# Patient Record
Sex: Female | Born: 1965
Health system: Southern US, Community
[De-identification: ages and names within clinical notes are randomized; demographics above are authoritative.]

## PROBLEM LIST (undated history)

## (undated) DIAGNOSIS — G4730 Sleep apnea, unspecified: Secondary | ICD-10-CM

## (undated) DIAGNOSIS — D689 Coagulation defect, unspecified: Secondary | ICD-10-CM

## (undated) DIAGNOSIS — I10 Essential (primary) hypertension: Secondary | ICD-10-CM

## (undated) HISTORY — PX: ABDOMINAL HYSTERECTOMY: SHX81

## (undated) HISTORY — PX: KNEE SURGERY: SHX244

## (undated) HISTORY — PX: TUBAL LIGATION: SHX77

## (undated) HISTORY — PX: BREAST REDUCTION SURGERY: SHX8

## (undated) HISTORY — DX: Coagulation defect, unspecified: D68.9

## (undated) HISTORY — DX: Sleep apnea, unspecified: G47.30

## (undated) HISTORY — PX: REDUCTION MAMMAPLASTY: SUR839

---

## 2002-07-14 ENCOUNTER — Other Ambulatory Visit: Admission: RE | Admit: 2002-07-14 | Discharge: 2002-07-14 | Payer: Self-pay | Admitting: Obstetrics and Gynecology

## 2003-07-17 ENCOUNTER — Other Ambulatory Visit: Admission: RE | Admit: 2003-07-17 | Discharge: 2003-07-17 | Payer: Self-pay | Admitting: Obstetrics and Gynecology

## 2003-09-01 ENCOUNTER — Emergency Department (HOSPITAL_COMMUNITY): Admission: EM | Admit: 2003-09-01 | Discharge: 2003-09-01 | Payer: Self-pay | Admitting: Emergency Medicine

## 2005-08-11 ENCOUNTER — Other Ambulatory Visit: Admission: RE | Admit: 2005-08-11 | Discharge: 2005-08-11 | Payer: Self-pay | Admitting: Obstetrics and Gynecology

## 2007-12-30 ENCOUNTER — Ambulatory Visit: Payer: Self-pay | Admitting: Internal Medicine

## 2007-12-30 LAB — CONVERTED CEMR LAB
ALT: 17 units/L (ref 0–35)
Basophils Absolute: 0 10*3/uL (ref 0.0–0.1)
Basophils Relative: 0.2 % (ref 0.0–3.0)
Bilirubin, Direct: 0.1 mg/dL (ref 0.0–0.3)
Calcium: 9.1 mg/dL (ref 8.4–10.5)
Cholesterol: 178 mg/dL (ref 0–200)
Creatinine, Ser: 0.8 mg/dL (ref 0.4–1.2)
GFR calc Af Amer: 102 mL/min
HCT: 38.8 % (ref 36.0–46.0)
Hemoglobin: 13.7 g/dL (ref 12.0–15.0)
MCHC: 35.3 g/dL (ref 30.0–36.0)
MCV: 92.2 fL (ref 78.0–100.0)
Monocytes Absolute: 0.1 10*3/uL (ref 0.1–1.0)
Neutro Abs: 3.2 10*3/uL (ref 1.4–7.7)
RBC: 4.21 M/uL (ref 3.87–5.11)
RDW: 13.1 % (ref 11.5–14.6)
Sodium: 139 meq/L (ref 135–145)
TSH: 1.52 microintl units/mL (ref 0.35–5.50)
Total Bilirubin: 0.4 mg/dL (ref 0.3–1.2)
Total Protein: 6.8 g/dL (ref 6.0–8.3)
Triglycerides: 96 mg/dL (ref 0–149)

## 2008-03-19 ENCOUNTER — Telehealth: Payer: Self-pay | Admitting: Internal Medicine

## 2008-03-20 ENCOUNTER — Telehealth: Payer: Self-pay | Admitting: Internal Medicine

## 2008-03-20 ENCOUNTER — Ambulatory Visit: Payer: Self-pay | Admitting: Internal Medicine

## 2008-03-20 DIAGNOSIS — K5289 Other specified noninfective gastroenteritis and colitis: Secondary | ICD-10-CM | POA: Insufficient documentation

## 2008-04-17 ENCOUNTER — Ambulatory Visit: Payer: Self-pay | Admitting: Internal Medicine

## 2008-08-14 ENCOUNTER — Ambulatory Visit: Payer: Self-pay | Admitting: Internal Medicine

## 2008-08-14 DIAGNOSIS — M722 Plantar fascial fibromatosis: Secondary | ICD-10-CM | POA: Insufficient documentation

## 2008-08-17 ENCOUNTER — Telehealth: Payer: Self-pay | Admitting: Internal Medicine

## 2008-08-18 ENCOUNTER — Ambulatory Visit: Payer: Self-pay | Admitting: Internal Medicine

## 2008-08-18 DIAGNOSIS — T887XXA Unspecified adverse effect of drug or medicament, initial encounter: Secondary | ICD-10-CM | POA: Insufficient documentation

## 2009-01-06 ENCOUNTER — Encounter (INDEPENDENT_AMBULATORY_CARE_PROVIDER_SITE_OTHER): Payer: Self-pay | Admitting: Obstetrics and Gynecology

## 2009-01-06 ENCOUNTER — Ambulatory Visit (HOSPITAL_COMMUNITY): Admission: RE | Admit: 2009-01-06 | Discharge: 2009-01-08 | Payer: Self-pay | Admitting: Obstetrics and Gynecology

## 2009-01-17 ENCOUNTER — Inpatient Hospital Stay (HOSPITAL_COMMUNITY): Admission: AD | Admit: 2009-01-17 | Discharge: 2009-01-17 | Payer: Self-pay | Admitting: Obstetrics and Gynecology

## 2009-01-21 ENCOUNTER — Telehealth (INDEPENDENT_AMBULATORY_CARE_PROVIDER_SITE_OTHER): Payer: Self-pay | Admitting: *Deleted

## 2009-03-15 ENCOUNTER — Ambulatory Visit: Payer: Self-pay | Admitting: Internal Medicine

## 2009-03-15 DIAGNOSIS — J069 Acute upper respiratory infection, unspecified: Secondary | ICD-10-CM | POA: Insufficient documentation

## 2009-09-18 ENCOUNTER — Emergency Department (HOSPITAL_COMMUNITY): Admission: EM | Admit: 2009-09-18 | Discharge: 2009-09-18 | Payer: Self-pay | Admitting: Family Medicine

## 2010-03-18 ENCOUNTER — Ambulatory Visit: Payer: Self-pay | Admitting: Family Medicine

## 2010-03-18 DIAGNOSIS — H6690 Otitis media, unspecified, unspecified ear: Secondary | ICD-10-CM | POA: Insufficient documentation

## 2010-03-18 DIAGNOSIS — J209 Acute bronchitis, unspecified: Secondary | ICD-10-CM | POA: Insufficient documentation

## 2010-03-18 DIAGNOSIS — R1110 Vomiting, unspecified: Secondary | ICD-10-CM | POA: Insufficient documentation

## 2010-04-18 ENCOUNTER — Ambulatory Visit: Payer: Self-pay | Admitting: Internal Medicine

## 2010-04-18 LAB — CONVERTED CEMR LAB
BUN: 13 mg/dL (ref 6–23)
Basophils Absolute: 0 10*3/uL (ref 0.0–0.1)
Bilirubin, Direct: 0.1 mg/dL (ref 0.0–0.3)
Chloride: 108 meq/L (ref 96–112)
Cholesterol: 161 mg/dL (ref 0–200)
Creatinine, Ser: 0.7 mg/dL (ref 0.4–1.2)
Eosinophils Absolute: 0.3 10*3/uL (ref 0.0–0.7)
Glucose, Bld: 87 mg/dL (ref 70–99)
HCT: 38.2 % (ref 36.0–46.0)
LDL Cholesterol: 101 mg/dL — ABNORMAL HIGH (ref 0–99)
Lymphs Abs: 4.2 10*3/uL — ABNORMAL HIGH (ref 0.7–4.0)
MCV: 93.8 fL (ref 78.0–100.0)
Monocytes Absolute: 0.5 10*3/uL (ref 0.1–1.0)
Neutrophils Relative %: 42.8 % — ABNORMAL LOW (ref 43.0–77.0)
Platelets: 299 10*3/uL (ref 150.0–400.0)
Potassium: 4 meq/L (ref 3.5–5.1)
RDW: 14.8 % — ABNORMAL HIGH (ref 11.5–14.6)
TSH: 1.08 microintl units/mL (ref 0.35–5.50)
Total Bilirubin: 0.5 mg/dL (ref 0.3–1.2)
Triglycerides: 56 mg/dL (ref 0.0–149.0)
VLDL: 11.2 mg/dL (ref 0.0–40.0)
WBC: 8.8 10*3/uL (ref 4.5–10.5)

## 2010-04-22 ENCOUNTER — Ambulatory Visit: Payer: Self-pay | Admitting: Family Medicine

## 2010-04-22 ENCOUNTER — Telehealth: Payer: Self-pay | Admitting: Family Medicine

## 2010-06-17 ENCOUNTER — Ambulatory Visit
Admission: RE | Admit: 2010-06-17 | Discharge: 2010-06-17 | Payer: Self-pay | Source: Home / Self Care | Attending: Internal Medicine | Admitting: Internal Medicine

## 2010-06-17 DIAGNOSIS — R609 Edema, unspecified: Secondary | ICD-10-CM | POA: Insufficient documentation

## 2010-06-17 DIAGNOSIS — G44209 Tension-type headache, unspecified, not intractable: Secondary | ICD-10-CM | POA: Insufficient documentation

## 2010-07-12 NOTE — Assessment & Plan Note (Signed)
Summary: severe cough and chest congstion/cjr   Vital Signs:  Patient profile:   45 year old female Height:      67 inches (170.18 cm) Weight:      249 pounds (113.18 kg) BMI:     39.14 O2 Sat:      94 % on Room air Temp:     99.8 degrees F (37.67 degrees C) oral Pulse rate:   128 / minute BP sitting:   122 / 82  (left arm) Cuff size:   large  Vitals Entered By: Josph Macho RMA (March 18, 2010 12:22 PM)  O2 Flow:  Room air CC: Severe cough, chest congestion, vomiting, lower back hurting, headache X3 days/ CF Is Patient Diabetic? No   History of Present Illness: Patient in today with 3 days worth of symptoms. She took the flu shot at work on [employer] and roughly 5-6 hours later she said she felt like she was hit by a bus. She was exhausted, achy and tired. She denies having any swelling in her throat or lips but during that night a cough began and has progressively worsened. Patient noting nausea and vomiting intermittent over the past several days but notably worse starting last night. Was unable to sleep last night due to the coughing fits which resulted in posttussive vomiting  all night long. Has been trying to drink fluids, oorr by mouth intake. Anorexia/malaise/myalgia/CP with coughing. Low grade v/c developing  Allergies: No Known Drug Allergies  Past History:  Past medical history reviewed for relevance to current acute and chronic problems. Social history (including risk factors) reviewed for relevance to current acute and chronic problems.  Past Medical History: Reviewed history from 12/30/2007 and no changes required. obesity tobacco use  Social History: Reviewed history from 12/30/2007 and no changes required. 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 Head:  Normocephalic and  atraumatic without obvious abnormalities. No apparent alopecia or balding. Ears:  Left ear TM is erythematous and dull. , Right TM mildly dull Nose:  mucosal erythema and mucosal edema.   Mouth:  Oral mucosa and oropharynx without lesions or exudates.  Teeth in good repair.pharyngeal erythema without edema. Neck:  No deformities, masses, or tenderness noted. Lungs:  R base crackles and L base crackles.   Heart:  Normal rate and regular rhythm. S1 and S2 normal without gallop, click, rub or other extra sounds. Abdomen:  Bowel sounds positive,abdomen soft and non-tender without masses, organomegaly or hernias noted. Extremities:  No clubbing, cyanosis, edema, or deformity noted with normal full range of motion of all joints.   Cervical Nodes:  R anterior LN tender and L anterior LN tender.   Psych:  Cognition and judgment appear intact. Alert and cooperative with normal attention span and concentration. No apparent delusions, illusions, hallucinations   Impression & Recommendations:  Problem # 1:  ACUTE BRONCHITIS (ICD-466.0)  Her updated medication list for this problem includes:    Zithromax 250 Mg Tabs (Azithromycin) .Marland Kitchen... 2 tabs by mouth once and then 1 tab by mouth once daily x 4 days    Tussionex Pennkinetic Er 10-8 Mg/24ml Lqcr (Hydrocod polst-chlorphen polst) .Marland Kitchen... 1 tsp by mouth two times a day as needed cough, causes sedation Started on Prednisone to help with inflammation, is struggling with post tussive vomitting so given some Promethazine as well  Problem #  2:  OTITIS MEDIA, ACUTE (ICD-382.9)  Her updated medication list for this problem includes:    Zithromax 250 Mg Tabs (Azithromycin) .Marland Kitchen... 2 tabs by mouth once and then 1 tab by mouth once daily x 4 days On left, report worsening symptoms  Problem # 3:  VOMITING (ICD-787.03)  Orders: Promethazine up to 50mg  (J2550) Admin of Therapeutic Inj  intramuscular or subcutaneous (16109) Push clear fluids and Gatorade  Complete  Medication List: 1)  Zithromax 250 Mg Tabs (Azithromycin) .... 2 tabs by mouth once and then 1 tab by mouth once daily x 4 days 2)  Prednisone 20 Mg Tabs (Prednisone) .... 2 tabs by mouth daily x 5 days 3)  Promethazine Hcl 25 Mg Tabs (Promethazine hcl) .Marland Kitchen.. 1 tab by mouth three times a day as needed cough 4)  Tussionex Pennkinetic Er 10-8 Mg/90ml Lqcr (Hydrocod polst-chlorphen polst) .Marland Kitchen.. 1 tsp by mouth two times a day as needed cough, causes sedation  Patient Instructions: 1)  Take your antibiotic as prescribed until ALL of it is gone, but stop if you develop a rash or swelling and contact our office as soon as possible.  2)  Acute Bronchitis symptoms for less then 10 days are not  helped by antibiotics. Take over the counter cough medications. Call if no improvement in 5-7 days, sooner if increasing cough, fever, or new symptoms ( shortness of breath, chest pain) .  3)  Take a probiotic whenever you take an antibiotic such as yogurt or an Align cap daily Prescriptions: TUSSIONEX PENNKINETIC ER 10-8 MG/5ML LQCR (HYDROCOD POLST-CHLORPHEN POLST) 1 tsp by mouth two times a day as needed cough, causes sedation  #4oz x 1   Entered and Authorized by:   Danise Edge MD   Signed by:   Danise Edge MD on 03/18/2010   Method used:   Print then Give to Patient   RxID:   [id number] PROMETHAZINE HCL 25 MG TABS (PROMETHAZINE HCL) 1 tab by mouth three times a day as needed cough  #40 x 1   Entered and Authorized by:   Danise Edge MD   Signed by:   Danise Edge MD on 03/18/2010   Method used:   Electronically to        [facility] [address] (retail)       [phone]       [place], [place]  [address]       Ph: [phone]       Fax: [phone]   RxID:   [id number] PREDNISONE 20 MG TABS (PREDNISONE) 2 tabs by mouth daily x 5 days  #10 x 0   Entered and Authorized by:   Danise Edge MD   Signed by:   Danise Edge MD on 03/18/2010   Method used:    Electronically to        [facility] [address] (retail)       [phone]       [place], [place]  [address]       Ph: [phone]       Fax: [phone]   RxID:   [id number] ZITHROMAX 250 MG TABS (AZITHROMYCIN) 2 tabs by mouth once and then 1 tab by mouth once daily x 4 days  #6 x 0   Entered and Authorized by:   Danise Edge MD   Signed by:   Danise Edge MD on 03/18/2010  Method used:   Electronically to        [facility] (retail)       [phone]       [place], [place]  [address]       Ph: [phone]       Fax: [phone]   RxID:   [id number]    Medication Administration  Injection # 1:    Medication: Promethazine up to 50mg     Diagnosis: VOMITING (ICD-787.03)    Route: IM    Site: LUOQ gluteus    Exp Date: 02/2011    Lot #: [id number]    Mfr: [employer]    Patient tolerated injection without complications    Given by: Josph Macho RMA (March 18, 2010 12:47 PM)  Orders Added: 1)  Promethazine up to 50mg  [J2550] 2)  Admin of Therapeutic Inj  intramuscular or subcutaneous [96372] 3)  Est. Patient Level IV [34742]

## 2010-07-12 NOTE — Assessment & Plan Note (Signed)
Summary: [name] (PT WILL COME FASTING) // RS   Vital Signs:  Patient profile:   45 year old female Height:      66.5 inches Weight:      259 pounds BMI:     41.33 Temp:     98.6 degrees F oral BP sitting:   150 / 90  (left arm) Cuff size:   regular  Vitals Entered By: Duard Brady LPN (April 18, 2010 9:42 AM) CC: cpx- doing well Is Patient Diabetic? No   CC:  cpx- doing well.  History of Present Illness: 45 year old patient who is seen today for a health maintenance examination.  She has a history of exogenous obesity, and ongoing tobacco use.  Aggressive risk factor modification discussed.  She takes no chronic medications.  Family history is positive for diabetes  Preventive Screening-Counseling & Management  Alcohol-Tobacco     Smoking Status: current     Smoking Cessation Counseling: yes  Allergies (verified): No Known Drug Allergies  Past History:  Past Medical History: Reviewed history from 12/30/2007 and no changes required. obesity tobacco use  Past Surgical History: Reviewed history from 12/30/2007 and no changes required. removal of bladder stones at age 20 breast reduction surgery at age 57  Family History: Reviewed history from 12/30/2007 and no changes required. both parents, age 49, both with history of type 2 diabetes 3 brothers two sisters  One brother deceased from complications of diabetes  Social History: Reviewed history from 12/30/2007 and no changes required. 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  The patient denies anorexia, fever, weight loss, vision loss, decreased hearing, hoarseness, chest pain, syncope, dyspnea on exertion, peripheral edema, prolonged cough, headaches, hemoptysis, abdominal pain, melena, hematochezia, severe indigestion/heartburn, hematuria, incontinence, genital sores, muscle weakness, suspicious skin lesions, transient  blindness, difficulty walking, depression, unusual weight change, abnormal bleeding, enlarged lymph nodes, angioedema, and breast masses.    Physical Exam  General:  overweight-appearing.  150/90 Head:  Normocephalic and atraumatic without obvious abnormalities. No apparent alopecia or balding. Eyes:  No corneal or conjunctival inflammation noted. EOMI. Perrla. Funduscopic exam benign, without hemorrhages, exudates or papilledema. Vision grossly normal. Ears:  External ear exam shows no significant lesions or deformities.  Otoscopic examination reveals clear canals, tympanic membranes are intact bilaterally without bulging, retraction, inflammation or discharge. Hearing is grossly normal bilaterally. Nose:  External nasal examination shows no deformity or inflammation. Nasal mucosa are pink and moist without lesions or exudates. Mouth:  Oral mucosa and oropharynx without lesions or exudates.  Teeth in good repair. Neck:  No deformities, masses, or tenderness noted. Chest Wall:  No deformities, masses, or tenderness noted. Lungs:  Normal respiratory effort, chest expands symmetrically. Lungs are clear to auscultation, no crackles or wheezes. Heart:  Normal rate and regular rhythm. S1 and S2 normal without gallop, murmur, click, rub or other extra sounds. Abdomen:  Bowel sounds positive,abdomen soft and non-tender without masses, organomegaly or hernias noted. Msk:  No deformity or scoliosis noted of thoracic or lumbar spine.   Pulses:  R and L carotid,radial,femoral,dorsalis pedis and posterior tibial pulses are full and equal bilaterally Extremities:  No clubbing, cyanosis, edema, or deformity noted with normal full range of motion of all joints.   Neurologic:  No cranial nerve deficits noted. Station and gait are normal. Plantar reflexes are down-going bilaterally. DTRs are symmetrical throughout. Sensory, motor and coordinative functions appear intact. Skin:  Intact without suspicious lesions or  rashes Cervical Nodes:  No lymphadenopathy noted Axillary Nodes:  No palpable lymphadenopathy Inguinal Nodes:  No significant adenopathy Psych:  Cognition and judgment appear intact. Alert and cooperative with normal attention span and concentration. No apparent delusions, illusions, hallucinations   Impression & Recommendations:  Problem # 1:  PREVENTIVE HEALTH CARE (ICD-V70.0)  Orders: Venipuncture (16109) TLB-Lipid Panel (80061-LIPID) TLB-BMP (Basic Metabolic Panel-BMET) (80048-METABOL) TLB-CBC Platelet - w/Differential (85025-CBCD) TLB-Hepatic/Liver Function Pnl (80076-HEPATIC) TLB-TSH (Thyroid Stimulating Hormone) (84443-TSH) Specimen Handling (60454)  Complete Medication List: 1)  Zithromax 250 Mg Tabs (Azithromycin) .... 2 tabs by mouth once and then 1 tab by mouth once daily x 4 days 2)  Prednisone 20 Mg Tabs (Prednisone) .... 2 tabs by mouth daily x 5 days 3)  Promethazine Hcl 25 Mg Tabs (Promethazine hcl) .Marland Kitchen.. 1 tab by mouth three times a day as needed cough 4)  Tussionex Pennkinetic Er 10-8 Mg/54ml Lqcr (Hydrocod polst-chlorphen polst) .Marland Kitchen.. 1 tsp by mouth two times a day as needed cough, causes sedation  Patient Instructions: 1)  Please schedule a follow-up appointment in 1 year. 2)  Limit your Sodium (Salt). 3)  Tobacco is very bad for your health and your loved ones! You Should stop smoking!. 4)  It is important that you exercise regularly at least 20 minutes 5 times a week. If you develop chest pain, have severe difficulty breathing, or feel very tired , stop exercising immediately and seek medical attention. 5)  You need to lose weight. Consider a lower calorie diet and regular exercise.  6)  Check your Blood Pressure regularly. If it is above: 150/90 you should make an appointment.   Orders Added: 1)  Est. Patient 40-64 years [99396] 2)  Venipuncture [09811] 3)  TLB-Lipid Panel [80061-LIPID] 4)  TLB-BMP (Basic Metabolic Panel-BMET) [80048-METABOL] 5)  TLB-CBC  Platelet - w/Differential [85025-CBCD] 6)  TLB-Hepatic/Liver Function Pnl [80076-HEPATIC] 7)  TLB-TSH (Thyroid Stimulating Hormone) [84443-TSH] 8)  Specimen Handling [99000]   Immunization History:  Tetanus/Td Immunization History:    Tetanus/Td:  Td (08/14/2008)  Influenza Immunization History:    Influenza:  Historical (03/26/2010)   Immunization History:  Influenza Immunization History:    Influenza:  Historical (03/26/2010) done at work per pt. KIK

## 2010-07-12 NOTE — Assessment & Plan Note (Signed)
Summary: L TOE FX? // RS   Vital Signs:  Patient profile:   45 year old female Height:      66.5 inches (168.91 cm) Weight:      256 pounds (116.36 kg) O2 Sat:      93 % on Room air Temp:     98.5 degrees F (36.94 degrees C) oral Pulse rate:   76 / minute BP sitting:   142 / 100  (left arm) Cuff size:   large  Vitals Entered By: Josph Macho RMA (April 22, 2010 9:31 AM)  O2 Flow:  Room air CC: Left toe fracture? (big toe) pt fell in garage last night (doesnt' know if she hit it on anyghting/ CF Is Patient Diabetic? No   History of Present Illness: 45 y/o AAF here for toe pain. Last night accidentally tripped in her garage, fell on left hip and also hit left foot. Hip feels a bit better, but toe still hurting ---making her "hobble" to walk.  Took some extra strength aspirin last night.  Denies any history of a fracture in the past.  Current Medications (verified): 1)  None  Allergies (verified): No Known Drug Allergies  Past History:  Past medical, surgical, family and social histories (including risk factors) reviewed, and no changes noted (except as noted below).  Past Medical History: Reviewed history from 12/30/2007 and no changes required. obesity tobacco use  Past Surgical History: Reviewed history from 12/30/2007 and no changes required. removal of bladder stones at age 66 breast reduction surgery at age 50  Family History: Reviewed history from 12/30/2007 and no changes required. both parents, age 10, both with history of type 2 diabetes 3 brothers two sisters  One brother deceased from complications of diabetes  Social History: Reviewed history from 12/30/2007 and no changes required. 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   No n/v.  No blood noted in urine or stool.    Physical Exam  General:  VS: noted, all normal. Gen: Alert, well appearing, oriented x 4. Left hip mildly TTP  laterally.  No bruising. Hip ROM intact but a bit painful with flex/ext. Left foot: no significant swelling, no erythema, no warmth.  She has focal TTP in the distal 1/3 of the 1st metatarsal, into the 1st MTP joint.  Otherwise nontender.  She can wiggles toes okay, denies sensory loss.  Ankle ROM intact without pain.  Toes warm/pink.   Impression & Recommendations:  Problem # 1:  CONTUSION OF TOE (ICD-924.3) Need x-ray to r/o fracture--ordered this today. Post op shoe rx'd today to wear any time she ambulates. Will make ortho referral as appropriate, depending on x-ray results. Take alleve 2 tabs two times a day for the next week.  Stop aspirin. F/u in 2 wks.   Pt declined my offer of any stronger pain meds. Orders: T-Foot Left Min 3 Views (73630TC)  Complete Medication List: 1)  Post Op Shoe Left  .... Wear shoe on left foot whenever walking  Patient Instructions: 1)  Please schedule a follow-up appointment in 2 weeks.  2)  Take 2 alleve twice daily for the next 1 week. 3)  Wear post op shoe on left foot and get your foot x-ray as we discussed.   Orders Added: 1)  New Patient Level III [14782] 2)  T-Foot Left Min 3 Views [73630TC]

## 2010-07-12 NOTE — Progress Notes (Signed)
Summary: xray today  Phone Note Call from Patient Call back at [phone]   Caller: Patient Call For: Gordy Savers  MD Summary of Call: pt had xray today. pt would like to know whats next step? Initial call taken by: Heron Sabins,  April 22, 2010 10:43 AM  Follow-up for Phone Call        Awaiting x-ray result, should be back in early afternoon.  Next step depends on this result.   Follow-up by: Michell Heinrich M.D.,  April 22, 2010 11:41 AM  Additional Follow-up for Phone Call Additional follow up Details #1::        pt is aware. Additional Follow-up by: Heron Sabins,  April 22, 2010 11:46 AM

## 2010-07-12 NOTE — Progress Notes (Signed)
  Please call her and let her know that her x-ray showed NO FRACTURE.   Wear post-op shoe and f/u as recommended.   Appended Document:  pt aware

## 2010-07-12 NOTE — Assessment & Plan Note (Signed)
Summary: cpx/no pap/ ok per doc/njr   Vital Signs:  Patient Profile:   45 Years Old Female Height:     67 inches Weight:      256 pounds Temp:     98.4 degrees F oral BP sitting:   130 / 80  (left arm) Cuff size:   large  Vitals Entered By: Raechel Ache, RN (April 17, 2008 12:58 PM)                Name and a third  Chief Complaint:  [name] and sees gyn.Patricia [name]  History of Present Illness: 45 year old female seen today for a health maintenance examination.  She receives annual gynecologic care.  She has no concerns or complaints today.  No further GI symptoms.    Current Allergies: No known allergies   Past Medical History:    Reviewed history from 12/30/2007 and no changes required:       obesity       tobacco use  Past Surgical History:    Reviewed history from 12/30/2007 and no changes required:       removal of bladder stones at age 42       breast reduction surgery at age 24   Family History:    Reviewed history from 12/30/2007 and no changes required:       both parents, age 64, both with history of type 2 diabetes       3 brothers two sisters              One brother deceased from complications of diabetes  Social History:    Reviewed history from 12/30/2007 and no changes required:       remarried and two daughters       Current Smoker       accountant   Risk Factors:     Counseled to quit/cut down tobacco use:  yes   Review of Systems  The patient denies anorexia, fever, weight loss, weight gain, vision loss, decreased hearing, hoarseness, chest pain, syncope, dyspnea on exertion, peripheral edema, prolonged cough, headaches, hemoptysis, abdominal pain, melena, hematochezia, severe indigestion/heartburn, hematuria, incontinence, genital sores, muscle weakness, suspicious skin lesions, transient blindness, difficulty walking, depression, unusual weight change, abnormal bleeding, enlarged lymph nodes, angioedema, and breast masses.     Physical  Exam  General:     overweight-appearing. 130/80 Head:     Normocephalic and atraumatic without obvious abnormalities. No apparent alopecia or balding. Eyes:     No corneal or conjunctival inflammation noted. EOMI. Perrla. Funduscopic exam benign, without hemorrhages, exudates or papilledema. Vision grossly normal. Ears:     External ear exam shows no significant lesions or deformities.  Otoscopic examination reveals clear canals, tympanic membranes are intact bilaterally without bulging, retraction, inflammation or discharge. Hearing is grossly normal bilaterally. Mouth:     Oral mucosa and oropharynx without lesions or exudates.  Teeth in good repair. Neck:     No deformities, masses, or tenderness noted. Chest Wall:     No deformities, masses, or tenderness noted. Lungs:     Normal respiratory effort, chest expands symmetrically. Lungs are clear to auscultation, no crackles or wheezes. Heart:     Normal rate and regular rhythm. S1 and S2 normal without gallop, murmur, click, rub or other extra sounds. Abdomen:     Bowel sounds positive,abdomen soft and non-tender without masses, organomegaly or hernias noted. Msk:     No deformity or scoliosis noted of thoracic or lumbar spine.  Pulses:     R and L carotid,radial,femoral,dorsalis pedis and posterior tibial pulses are full and equal bilaterally Extremities:     No clubbing, cyanosis, edema, or deformity noted with normal full range of motion of all joints.   Neurologic:     No cranial nerve deficits noted. Station and gait are normal. Plantar reflexes are down-going bilaterally. DTRs are symmetrical throughout. Sensory, motor and coordinative functions appear intact. Skin:     Intact without suspicious lesions or rashes Cervical Nodes:     No lymphadenopathy noted Axillary Nodes:     No palpable lymphadenopathy Inguinal Nodes:     No significant adenopathy Psych:     Cognition and judgment appear intact. Alert and cooperative  with normal attention span and concentration. No apparent delusions, illusions, hallucinations    Impression & Recommendations:  Problem # 1:  PREVENTIVE HEALTH CARE (ICD-V70.0)  Complete Medication List: 1)  Promethazine Hcl 25 Mg Tabs (Promethazine hcl) .... One tab every 6 hours as needed for nausea   Patient Instructions: 1)  Tobacco is very bad for your health and your loved ones! You Should stop smoking!. 2)  It is important that you exercise regularly at least 20 minutes 5 times a week. If you develop chest pain, have severe difficulty breathing, or feel very tired , stop exercising immediately and seek medical attention. 3)  You need to lose weight. Consider a lower calorie diet and regular exercise.  4)  Please schedule a follow-up appointment in 1 year.   ]

## 2010-07-13 ENCOUNTER — Other Ambulatory Visit: Payer: Self-pay | Admitting: Internal Medicine

## 2010-07-13 DIAGNOSIS — Z1239 Encounter for other screening for malignant neoplasm of breast: Secondary | ICD-10-CM

## 2010-07-14 NOTE — Assessment & Plan Note (Signed)
Summary: elevated BP readings/dm   Vital Signs:  Patient profile:   45 year old female Weight:      255 pounds Temp:     98.1 degrees F oral BP sitting:   128 / 70  (right arm) Cuff size:   large  Vitals Entered By: Duard Brady LPN (June 17, 2010 9:16 AM) CC: concerns about elevated BP - headaches , ankle swelling Is Patient Diabetic? No   CC:  concerns about elevated BP - headaches  and ankle swelling.  History of Present Illness: 45 year old patient who is seen today in with a chief complaint of pedal edema.  She has a history of hypertension in the past, but more recently has been controlled off medication.  Also describes some episodic headaches.  There are quickly relieved by Aleve ; she did have a manual exam, and laboratory studies in November of last year.  She feels well, today except for headaches.  Her peripheral edema has resolved.  She has been doing much traveling via car during the Christmas holidays  Allergies (verified): No Known Drug Allergies  Past History:  Past Medical History: Reviewed history from 12/30/2007 and no changes required. obesity tobacco use  Past Surgical History: Reviewed history from 12/30/2007 and no changes required. removal of bladder stones at age 59 breast reduction surgery at age 37  Review of Systems       The patient complains of peripheral edema.  The patient denies anorexia, fever, weight loss, weight gain, vision loss, decreased hearing, hoarseness, chest pain, syncope, dyspnea on exertion, prolonged cough, headaches, hemoptysis, abdominal pain, melena, hematochezia, severe indigestion/heartburn, hematuria, incontinence, genital sores, muscle weakness, suspicious skin lesions, transient blindness, difficulty walking, depression, unusual weight change, abnormal bleeding, enlarged lymph nodes, angioedema, and breast masses.    Physical Exam  General:  overweight-appearing.  124/80overweight-appearing.   Head:   Normocephalic and atraumatic without obvious abnormalities. No apparent alopecia or balding. Eyes:  No corneal or conjunctival inflammation noted. EOMI. Perrla. Funduscopic exam benign, without hemorrhages, exudates or papilledema. Vision grossly normal. Ears:  External ear exam shows no significant lesions or deformities.  Otoscopic examination reveals clear canals, tympanic membranes are intact bilaterally without bulging, retraction, inflammation or discharge. Hearing is grossly normal bilaterally. Nose:  External nasal examination shows no deformity or inflammation. Nasal mucosa are pink and moist without lesions or exudates. Mouth:  Oral mucosa and oropharynx without lesions or exudates.  Teeth in good repair. Neck:  No deformities, masses, or tenderness noted. Chest Wall:  No deformities, masses, or tenderness noted. Breasts:  No mass, nodules, thickening, tenderness, bulging, retraction, inflamation, nipple discharge or skin changes noted.   Lungs:  Normal respiratory effort, chest expands symmetrically. Lungs are clear to auscultation, no crackles or wheezes. Heart:  Normal rate and regular rhythm. S1 and S2 normal without gallop, murmur, click, rub or other extra sounds. Abdomen:  Bowel sounds positive,abdomen soft and non-tender without masses, organomegaly or hernias noted. Msk:  No deformity or scoliosis noted of thoracic or lumbar spine.   Pulses:  R and L carotid,radial,femoral,dorsalis pedis and posterior tibial pulses are full and equal bilaterally Extremities:  No clubbing, cyanosis, edema, or deformity noted with normal full range of motion of all joints.   Skin:  Intact without suspicious lesions or rashes Cervical Nodes:  No lymphadenopathy noted Axillary Nodes:  No palpable lymphadenopathy   Impression & Recommendations:  Problem # 1:  ANKLE EDEMA (ICD-782.3)  Problem # 2:  HEADACHE, TENSION (ICD-307.81)  Patient Instructions: 1)  Please schedule a follow-up appointment  in 6 months. 2)  Limit your Sodium (Salt). 3)  It is important that you exercise regularly at least 20 minutes 5 times a week. If you develop chest pain, have severe difficulty breathing, or feel very tired , stop exercising immediately and seek medical attention. 4)  You need to lose weight. Consider a lower calorie diet and regular exercise.    Orders Added: 1)  Est. Patient Level III [16109]

## 2010-08-03 ENCOUNTER — Ambulatory Visit
Admission: RE | Admit: 2010-08-03 | Discharge: 2010-08-03 | Disposition: A | Discharge status: Home / Self Care | Payer: 59 | Source: Ambulatory Visit | Attending: Internal Medicine | Admitting: Internal Medicine

## 2010-08-03 DIAGNOSIS — Z1239 Encounter for other screening for malignant neoplasm of breast: Secondary | ICD-10-CM

## 2010-08-31 LAB — COMPREHENSIVE METABOLIC PANEL
ALT: 17 U/L (ref 0–35)
Albumin: 3.8 g/dL (ref 3.5–5.2)
Calcium: 8.8 mg/dL (ref 8.4–10.5)
GFR calc Af Amer: >60 mL/min (ref >60)
Glucose, Bld: 90 mg/dL (ref 70–99)
Potassium: 3.5 mEq/L (ref 3.5–5.1)
Sodium: 136 mEq/L (ref 135–145)
Total Protein: 7.1 g/dL (ref 6.0–8.3)

## 2010-08-31 LAB — CBC
HCT: 41.5 % (ref 36.0–46.0)
Hemoglobin: 13.8 g/dL (ref 12.0–15.0)
MCHC: 33.2 g/dL (ref 30.0–36.0)
MCV: 93.9 fL (ref 78.0–100.0)
Platelets: 330 10*3/uL (ref 150–400)
RBC: 4.42 MIL/uL (ref 3.87–5.11)
RDW: 14.1 % (ref 11.5–15.5)
WBC: 9.3 10*3/uL (ref 4.0–10.5)

## 2010-08-31 LAB — LIPASE, BLOOD: Lipase: 19 U/L (ref 11–59)

## 2010-09-17 LAB — URINE MICROSCOPIC-ADD ON

## 2010-09-17 LAB — URINALYSIS, ROUTINE W REFLEX MICROSCOPIC
Bilirubin Urine: NEGATIVE
Ketones, ur: NEGATIVE mg/dL
Specific Gravity, Urine: >1.03 — ABNORMAL HIGH (ref 1.005–1.030)
pH: 5.5 (ref 5.0–8.0)

## 2010-09-17 LAB — CBC
HCT: 38 % (ref 36.0–46.0)
Hemoglobin: 13 g/dL (ref 12.0–15.0)
MCV: 93.2 fL (ref 78.0–100.0)
RDW: 13.4 % (ref 11.5–15.5)
WBC: 7.6 10*3/uL (ref 4.0–10.5)

## 2010-09-18 LAB — CBC
HCT: 33.4 % — ABNORMAL LOW (ref 36.0–46.0)
Hemoglobin: 11.4 g/dL — ABNORMAL LOW (ref 12.0–15.0)
Platelets: 260 10*3/uL (ref 150–400)
RBC: 3.53 MIL/uL — ABNORMAL LOW (ref 3.87–5.11)
RBC: 4.07 MIL/uL (ref 3.87–5.11)
WBC: 12.9 10*3/uL — ABNORMAL HIGH (ref 4.0–10.5)
WBC: 6.2 10*3/uL (ref 4.0–10.5)

## 2010-09-18 LAB — BASIC METABOLIC PANEL
Calcium: 9.1 mg/dL (ref 8.4–10.5)
Creatinine, Ser: 0.84 mg/dL (ref 0.4–1.2)
GFR calc Af Amer: >60 mL/min (ref >60)
GFR calc non Af Amer: >60 mL/min (ref >60)
Sodium: 138 mEq/L (ref 135–145)

## 2010-09-18 LAB — PREGNANCY, URINE: Preg Test, Ur: NEGATIVE

## 2010-10-25 NOTE — Op Note (Signed)
NAME:  Patricia [name], Patricia [name] NO.:  [id number]   MEDICAL RECORD NO.:  [id number]          PATIENT TYPE:  OIB   LOCATION:  9306                          FACILITY:  [facility]   PHYSICIAN:  Zenaida Niece, M.D.DATE OF BIRTH:  [birth year]   DATE OF PROCEDURE:  01/06/2009  DATE OF DISCHARGE:                               OPERATIVE REPORT   PREOPERATIVE DIAGNOSES:  Dysmenorrhea, menorrhagia, pelvic pain.   POSTOPERATIVE DIAGNOSES:  Dysmenorrhea, menorrhagia, pelvic pain, and  endometriosis and pelvic adhesions.   PROCEDURES:  Laparoscopic-assisted vaginal hysterectomy and cystoscopy.   SURGEON:  Zenaida Niece, MD   ASSISTANT:  Malachi Pro. Ambrose Mantle, MD   ANESTHESIA:  General endotracheal tube.   FINDINGS:  She had a normal uterus.  She had evidence of prior tubal  ligation with some sort of clip.  She had adhesions and endometriosis in  the right ovarian fossa.   SPECIMENS:  Uterus with cervix sent for routine pathology.   ESTIMATED BLOOD LOSS:  100 mL.   COMPLICATIONS:  None.   PROCEDURE IN DETAIL:  The patient was taken to the operating room and  placed in the dorsal supine position.  Both arms were tucked to her  side.  General anesthesia was induced and she was placed in mobile  stirrups.  Abdomen, perineum, and vagina were then prepped and draped in  the usual sterile fashion and bladder drained with a red Robinson  catheter.  Infraumbilical skin was then infiltrated with 0.25% Marcaine  and a o.75 cm vertical incision was made.  The Veress needle was  inserted into the peritoneal cavity and placement confirmed by the water  drop test and opening pressure of 7 mmHg.  CO2 gas was insufflated to a  pressure of 14 mmHg and the Veress needle was removed.  A 5-mm port was  then introduced with direct visualization with the laparoscope.  5-mm  ports were then placed bilaterally also under direct visualization.  Inspection revealed the above-mentioned findings with normal  upper  abdomen and normal appendix.  The left uterine cornu was grasped with a  laparoscopic tenaculum.  The harmonic scalpel Ace was used to take down  the left fallopian tube, round ligament, utero-ovarian ligament, and  broad ligament.  There was some bleeding at the inferior portion of the  broad ligament that required a fair amount of work to obtain hemostasis.  The anterior portion of the peritoneum was incised across the anterior  portion of the uterus to push the bladder inferior.  The site was  irrigated and found to be adequately hemostatic.  The uterus was pulled  anterior and using the harmonic scalpel Ace, the adhesions of the right  ovary to the back of the uterus and the pelvic sidewall were taken down  with harmonic scalpel Ace.  This was done with good freeing of  adhesions.  Some adhesions to the posterior cervix were also taken down  with the harmonic scalpel Ace, careful to avoid any bowel.  The  tenaculum was then used from the patient's left side, grabbing the right  uterine cornu, and the harmonic scalpel was used  on the right side to  take down the right round ligament, fallopian tube, utero-ovarian  pedicle, and broad ligaments.  Anterior peritoneum was incised across  the anterior portion of the uterus and the bladder pushed inferior.  Some loose connective tissue was taken down and this was felt to be  adequate dissection.   Attention was turned vaginally.  All instruments were removed  laparoscopically.  The legs were elevated in stirrups.  A weighted  speculum was placed into the vagina and the cervix was grasped with  Christella Hartigan tenaculums.  Deaver retractors were used anteriorly and  laterally.  The cervicovaginal mucosa was infiltrated with a dilute  solution of Pitressin and then incised circumferentially with  electrocautery.  Sharp dissection was then used to further free the  vagina from the cervix.  Anterior peritoneum was identified and entered  sharply  and a Deaver retractor used to retract the bladder anterior.  Posterior cul-de-sac was entered with some difficulty due to the  adhesions seen by the laparoscope.  However, I was able to enter this.  The left uterosacral ligament was then clamped, transected and ligated  with #1 chromic and tagged for later use.  Right uterosacral ligament  was then clamped, transected, and ligated with #1 chromic and tagged for  later use.  Cardinal ligament and uterine artery on the right side were  taken down and sutured with #1 chromic.  Attention was turned back to  the left side, where the left cardinal ligament in the uterus and  uterine artery were taken down, transected, and ligated with #1 chromic.  Small bites on each side remained and these were clamped, transected,  and ligated with #1 chromic, and the uterus was removed.  No significant  bleeding was noted.  The uterosacral ligaments were plicated in the  midline with 2-0 silk.  The previously tagged uterosacral pedicles were  also tied in the midline.  Again, no significant bleeding was noted.  The vagina was then closed in a running locking fashion with 2-0 Vicryl.  All instruments were then removed from the vagina.  Attention was turned  to cystoscopy.  The patient had been given indigo carmine IV.  A 70-  degrees cystoscope was inserted through the urethra into the bladder.  Good visualization was achieved.  The bladder appeared normal.  Blue  tinged urine was seen to flow freely from each ureteral orifice.  Cystoscope was removed and a Foley catheter was placed.   Attention was turned back to the laparoscope.  The scrub tech and myself  changed gloves.  The patient was placed back in Trendelenburg and the  laparoscope was reinserted.  The pelvis was irrigated and no significant  bleeding was noted.  All laparoscopic trocars were then removed and gas  was allowed to deflate from the abdomen.  Skin incisions were closed  with interrupted  subcuticular sutures of 4-0 Vicryl followed by  Dermabond.  The patient was awakened in the operating room and taken to  the recovery room in stable condition after tolerating the procedure  well.  Counts were correct, she received Ancef 2 g IV at the beginning  of the procedure and had PAS hose on throughout the procedure.      Zenaida Niece, M.D.  Electronically Signed     TDM/MEDQ  D:  01/06/2009  T:  01/07/2009  Job:  [id number]

## 2010-10-25 NOTE — H&P (Signed)
NAME:  Patricia [name], Patricia [name] NO.:  [id number]   MEDICAL RECORD NO.:  [id number]          PATIENT TYPE:  AMB   LOCATION:  SDC                           FACILITY:  [facility]   PHYSICIAN:  Zenaida Niece, M.D.DATE OF BIRTH:  1965/10/22   DATE OF ADMISSION:  DATE OF DISCHARGE:                              HISTORY & PHYSICAL   CHIEF COMPLAINT:  Menorrhagia, dysmenorrhea, and pelvic pain.   HISTORY OF PRESENT ILLNESS:  This is a 45 year old female, para 2-0-2-2,  who was seen for an annual exam in March of this year.  At that time,  she was having periods every 1-3 months, but when she had them they were  fairly heavy.  She also had a significant amount of pain with her  periods.  She was also tender on exam at that point.  Uterus felt  enlarged with a possible mass on the right side.  She was put on  doxycycline and then had a pelvic ultrasound performed on April 5.  This  revealed 2 small myomas and a follicle on the right ovary, but no  significant source for her pain.  The patient continues to have pain and  at this point wishes to proceed with definitive surgical therapy with  hysterectomy and is being admitted for this at this time.   PAST OBSTETRICS HISTORY:  Two vaginal deliveries at term without  complications.   PAST MEDICAL HISTORY:  History of peptic ulcer disease, a reaction to  tetanus, mild hypertension.   PAST SURGICAL HISTORY:  Breast reduction, bilateral tubal ligation, and  cholecystectomy.   ALLERGIES:  None known.   CURRENT MEDICATIONS:  Prednisone and pain medication.   SOCIAL HISTORY:  The patient is married and denies alcohol, tobacco, or  drug use.   FAMILY HISTORY:  No GYN or colon cancer.   REVIEW OF SYSTEMS:  She has normal bowel and bladder function.  She is  sexually active with some pain afterwards.  No chest pain or shortness  of breath.   PHYSICAL EXAMINATION:  VITAL SIGNS:  Weight is 260 pounds, blood  pressure in the office is  150/100.  GENERAL:  This is an obese female in no acute distress.  NECK:  Supple without lymphadenopathy or thyromegaly.  LUNGS:  Clear to auscultation.  HEART:  Regular rate and rhythm without murmur.  ABDOMEN:  Soft, nondistended without a palpable mass.  She is tender in  both lower quadrants, the right greater than the left.  EXTREMITIES:  Have no edema and are nontender.  PELVIC:  External genitalia has no lesions.  SPECULUM:  The cervix and vagina are normal.  Pap smear performed was  normal with negative gonorrhea and Chlamydia cultures.  BIMANUAL:  Uterus is slightly enlarged and tender.  She has slightly  tender adnexa on the right greater than the left with a possible mass on  the right side by rectovaginal exam.  This is not confirmed by  ultrasound.   ASSESSMENT:  Pelvic pain with dysmenorrhea and menorrhagia.  All  nonsurgical and surgical options have been discussed with the patient.  She has failed conservative  therapy with antibiotics.  The patient  wishes to undergo definitive surgical therapy.  All risks and routes of  surgery have been discussed with her and she understands.   PLAN:  To admit the patient on the day of surgery for laparoscopic-  assisted vaginal hysterectomy and cystoscopy.  We will leave her ovaries  unless they appear abnormal.      Zenaida Niece, M.D.  Electronically Signed     TDM/MEDQ  D:  01/05/2009  T:  01/06/2009  Job:  [id number]

## 2010-11-24 ENCOUNTER — Encounter: Payer: Self-pay | Admitting: Internal Medicine

## 2010-11-24 ENCOUNTER — Ambulatory Visit (INDEPENDENT_AMBULATORY_CARE_PROVIDER_SITE_OTHER): Payer: 59 | Admitting: Internal Medicine

## 2010-11-24 VITALS — BP 122/80 | Temp 98.3°F | Wt 256.0 lb

## 2010-11-24 DIAGNOSIS — H6690 Otitis media, unspecified, unspecified ear: Secondary | ICD-10-CM

## 2010-11-24 MED ORDER — NEOMYCIN-POLYMYXIN-HC 3.5-10000-1 OT SOLN: 3.0000 [drp] | Freq: Three times a day (TID) | OTIC | Status: AC

## 2010-11-24 MED ORDER — FEXOFENADINE-PSEUDOEPHED ER 180-240 MG PO TB24: 1.0000 | ORAL_TABLET | Freq: Every day | ORAL | Status: AC

## 2010-11-24 NOTE — Progress Notes (Signed)
  Subjective:    Patient ID: Patricia [name], female    DOB: [birth year], 45 y.o.   MRN: [id number]  HPI  45 year old patient who presents with a five-day history of right ear discomfort. She describes a muffled sensation and no significant hearing loss. There has been no drainage fever or URI symptoms or other constitutional complaints. She has had a history of otitis media in the past    Review of Systems  HENT: Positive for ear pain. Negative for hearing loss and ear discharge.        Objective:   Physical Exam  Constitutional: She is oriented to person, place, and time. She appears well-developed and well-nourished.  HENT:  Head: Normocephalic.  Right Ear: External ear normal.  Left Ear: External ear normal.  Mouth/Throat: Oropharynx is clear and moist.       The right tympanic membrane slightly dull. Weber lateralized to the left  Eyes: Conjunctivae and EOM are normal. Pupils are equal, round, and reactive to light.  Neck: Normal range of motion. Neck supple. No thyromegaly present.  Cardiovascular: Normal rate, regular rhythm, normal heart sounds and intact distal pulses.   Pulmonary/Chest: Effort normal and breath sounds normal.  Abdominal: Soft. Bowel sounds are normal. She exhibits no mass. There is no tenderness.  Musculoskeletal: Normal range of motion.  Lymphadenopathy:    She has no cervical adenopathy.  Neurological: She is alert and oriented to person, place, and time.  Skin: Skin is warm and dry. No rash noted.  Psychiatric: She has a normal mood and affect. Her behavior is normal.          Assessment & Plan:   Right otalgia possibly mild otitis externa. Will treat with Cortisporin otic drops and Allegra-D

## 2010-11-24 NOTE — Patient Instructions (Signed)
Call or return to clinic prn if these symptoms worsen or fail to improve as anticipated.

## 2011-01-11 ENCOUNTER — Telehealth: Payer: Self-pay | Admitting: *Deleted

## 2011-01-11 ENCOUNTER — Encounter: Payer: Self-pay | Admitting: Internal Medicine

## 2011-01-11 ENCOUNTER — Ambulatory Visit (INDEPENDENT_AMBULATORY_CARE_PROVIDER_SITE_OTHER): Payer: 59 | Admitting: Internal Medicine

## 2011-01-11 VITALS — BP 130/80 | Temp 98.6°F | Wt 256.0 lb

## 2011-01-11 DIAGNOSIS — H109 Unspecified conjunctivitis: Secondary | ICD-10-CM

## 2011-01-11 MED ORDER — SULFACETAMIDE SODIUM 10 % OP SOLN: 2.0000 [drp] | Freq: Four times a day (QID) | OPHTHALMIC | Status: AC

## 2011-01-11 NOTE — Telephone Encounter (Signed)
Appt was scheduled for pt.

## 2011-01-11 NOTE — Telephone Encounter (Signed)
Pt called to ask for RX for pink eye with no other details.  Left message to call back with more symptoms.

## 2011-01-11 NOTE — Progress Notes (Signed)
  Subjective:    Patient ID: Noel Gerold, female    DOB: Oct 20, 1965, 45 y.o.   MRN: [id number]  HPI 45 year old patient who presents with a one-day history of redness drainage and itching involving the left eye only. There's been no change in her vision. No prior history of conjunctivitis. No pertinent exposure history   Review of Systems  Eyes: Positive for discharge, redness and itching. Negative for photophobia, pain and visual disturbance.       Objective:   Physical Exam  Eyes:       Mild conjunctival injection on the left no exudate noted          Assessment & Plan:    Conjunctivitis OS. We'll treat with Bleph-10 ophthalmic drops. She will call if unimproved

## 2011-01-11 NOTE — Patient Instructions (Signed)
Call or return to clinic prn if these symptoms worsen or fail to improve as anticipated.

## 2011-06-19 ENCOUNTER — Ambulatory Visit (INDEPENDENT_AMBULATORY_CARE_PROVIDER_SITE_OTHER): Payer: 59 | Admitting: Internal Medicine

## 2011-06-19 ENCOUNTER — Encounter: Payer: Self-pay | Admitting: Internal Medicine

## 2011-06-19 ENCOUNTER — Telehealth: Payer: Self-pay | Admitting: Family Medicine

## 2011-06-19 VITALS — BP 130/80 | Temp 98.5°F

## 2011-06-19 DIAGNOSIS — M549 Dorsalgia, unspecified: Secondary | ICD-10-CM

## 2011-06-19 MED ORDER — CYCLOBENZAPRINE HCL 10 MG PO TABS: 10.0000 mg | ORAL_TABLET | Freq: Three times a day (TID) | ORAL | Status: AC | PRN

## 2011-06-19 MED ORDER — OXYCODONE-ACETAMINOPHEN 10-325 MG PO TABS: 1.0000 | ORAL_TABLET | Freq: Three times a day (TID) | ORAL | Status: AC | PRN

## 2011-06-19 MED ORDER — METHYLPREDNISOLONE ACETATE 80 MG/ML IJ SUSP
80.0000 mg | Freq: Once | INTRAMUSCULAR | Status: AC
  Administered 2011-06-19: 80 mg via INTRAMUSCULAR

## 2011-06-19 MED ORDER — METHYLPREDNISOLONE ACETATE 80 MG/ML IJ SUSP: 80.0000 mg | Freq: Once | INTRAMUSCULAR | Status: DC

## 2011-06-19 NOTE — Progress Notes (Signed)
Addended by: Duard Brady I on: 06/19/2011 12:03 PM   Modules accepted: Orders

## 2011-06-19 NOTE — Patient Instructions (Signed)
Most patients with low back pain will improve with time over the next two weeks.  Keep active but avoid any activities that cause pain.  Apply moist heat to the low back area several times daily.   Take Aleve 200 mg twice daily for pain or swelling  Mri as discussed

## 2011-06-19 NOTE — Progress Notes (Signed)
  Subjective:    Patient ID: Patricia [name], female    DOB: [birth year], 46 y.o.   MRN: [id number]  HPI  46 year old patient without prior history of back pain who presents with a two-week history of severe lumbar discomfort. Pain is aggravated by movement. She is able to sleep through the night and find a comfortable position but pain is aggravated by any movement. No constitutional symptoms no leg weakness no bowel or bladder symptoms. Today she noted the onset of tingling involving the both the hip and thigh areas;  there's been no fall or injury. She has a very difficult time with ambulation due to the severity of the pain. She has been using ibuprofen only    Review of Systems  Constitutional: Negative.   HENT: Negative for hearing loss, congestion, sore throat, rhinorrhea, dental problem, sinus pressure and tinnitus.   Eyes: Negative for pain, discharge and visual disturbance.  Respiratory: Negative for cough and shortness of breath.   Cardiovascular: Negative for chest pain, palpitations and leg swelling.  Gastrointestinal: Negative for nausea, vomiting, abdominal pain, diarrhea, constipation, blood in stool and abdominal distention.  Genitourinary: Negative for dysuria, urgency, frequency, hematuria, flank pain, vaginal bleeding, vaginal discharge, difficulty urinating, vaginal pain and pelvic pain.  Musculoskeletal: Positive for back pain. Negative for joint swelling, arthralgias and gait problem.  Skin: Negative for rash.  Neurological: Negative for dizziness, syncope, speech difficulty, weakness, numbness and headaches.  Hematological: Negative for adenopathy.  Psychiatric/Behavioral: Negative for behavioral problems, dysphoric mood and agitation. The patient is not nervous/anxious.        Objective:   Physical Exam  Constitutional: She appears well-developed and well-nourished. No distress.       Obese. Requires assistance to transfer and ambulate  Musculoskeletal:   Tenderness in the lumbar area Positive straight leg test on the right  The patient was able to stand on her toes and heels Achilles reflexes were intact          Assessment & Plan:   Acute low back pain. Due to the severity and chronicity of her pain we'll proceed with a lumbar MRI bilateral leg symptoms also worrisome.  We'll treat with Depo-Medrol analgesics and muscle relaxers

## 2011-06-19 NOTE — Telephone Encounter (Signed)
Office Message from Date: 06/19/2011 12:00:00 AM Time of Call: [phone] Faxed To: [facility] CallerTaelor Waymire Fax Number: [phone] Facility: home Patient: Patricia [name], Patricia [name] DOB: [birth year] Phone: [phone] Provider: Eleonore Chiquito Message: Needs callback back pain has made her legs numb.

## 2011-06-20 ENCOUNTER — Ambulatory Visit
Admission: RE | Admit: 2011-06-20 | Discharge: 2011-06-20 | Disposition: A | Discharge status: Home / Self Care | Payer: 59 | Source: Ambulatory Visit | Attending: Internal Medicine | Admitting: Internal Medicine

## 2011-06-20 DIAGNOSIS — M549 Dorsalgia, unspecified: Secondary | ICD-10-CM

## 2011-06-21 ENCOUNTER — Telehealth: Payer: Self-pay

## 2011-06-21 NOTE — Progress Notes (Signed)
Quick Note:  Spoke with pt- informed results normal - still in pian , what is the next step? ______

## 2011-06-21 NOTE — Telephone Encounter (Signed)
Spoke with pt- informed of dr. kwiatkowski's instructions 

## 2011-06-21 NOTE — Telephone Encounter (Signed)
Pt aware MRI normal - still in pain - what is the next step?

## 2011-06-21 NOTE — Telephone Encounter (Signed)
Continue pain medications rest heat;  natural history is very favorable and pain should improve over the next one or 2 weeks

## 2011-06-22 NOTE — Progress Notes (Signed)
Quick Note:  Spoke with pt- no worse no better - seems to be worse if she sits to long. ______

## 2011-07-04 ENCOUNTER — Telehealth: Payer: Self-pay | Admitting: Internal Medicine

## 2011-07-04 NOTE — Telephone Encounter (Signed)
Patient or  family member will need to come by for a new prescription within the next day or 2 for a refill on the oxycodone if needed

## 2011-07-04 NOTE — Telephone Encounter (Signed)
Advised pt to continue with hot/acold compresses and rest.  Pt advised to give back pain some time.  Pt states she has a refill on flexeril but only has 4 to 5 oxycodone left.  Pls advise.

## 2011-07-04 NOTE — Telephone Encounter (Signed)
Called and left message on her answering machine

## 2011-07-04 NOTE — Telephone Encounter (Signed)
Pt was seen on 06-19-2011 for back pain. Pt is partially better please advise

## 2011-07-05 MED ORDER — OXYCODONE-ACETAMINOPHEN 10-325 MG PO TABS: 1.0000 | ORAL_TABLET | Freq: Three times a day (TID) | ORAL | Status: AC | PRN

## 2011-07-05 NOTE — Telephone Encounter (Signed)
Pt aware Rx printed

## 2011-12-27 ENCOUNTER — Ambulatory Visit (INDEPENDENT_AMBULATORY_CARE_PROVIDER_SITE_OTHER): Payer: 59 | Admitting: Internal Medicine

## 2011-12-27 ENCOUNTER — Ambulatory Visit (INDEPENDENT_AMBULATORY_CARE_PROVIDER_SITE_OTHER)
Admission: RE | Admit: 2011-12-27 | Discharge: 2011-12-27 | Disposition: A | Discharge status: Home / Self Care | Payer: 59 | Source: Ambulatory Visit | Attending: Internal Medicine | Admitting: Internal Medicine

## 2011-12-27 ENCOUNTER — Encounter: Payer: Self-pay | Admitting: Internal Medicine

## 2011-12-27 VITALS — BP 130/90 | Temp 98.1°F | Wt 261.0 lb

## 2011-12-27 DIAGNOSIS — M79609 Pain in unspecified limb: Secondary | ICD-10-CM

## 2011-12-27 DIAGNOSIS — M79675 Pain in left toe(s): Secondary | ICD-10-CM

## 2011-12-27 NOTE — Progress Notes (Signed)
  Subjective:    Patient ID: Patricia [name], female    DOB: 1966/02/11, 46 y.o.   MRN: [id number]  HPI  46 year old patient who is seen today for evaluation of persistent pain and swelling involving her left great toe. She sustained a traumatic injury when she fell approximately one week ago while visiting in [place].    Review of Systems  Musculoskeletal: Positive for joint swelling and arthralgias.       Objective:   Physical Exam  Musculoskeletal:       Mild soft tissue swelling and resolving ecchymosis involving her left great toe. Range of motion is somewhat limited due to the discomfort. No misalignment          Assessment & Plan:   Toe contusion. Rule out fracture. An x-ray will be obtained Foot care discussed Continue anti-inflammatories She declined additional analgesics

## 2011-12-27 NOTE — Patient Instructions (Signed)
X-ray as discussed   Take Aleve 200 mg twice daily for pain or swelling  Call or return to clinic prn if these symptoms worsen or fail to improve as anticipated.

## 2011-12-28 ENCOUNTER — Telehealth: Payer: Self-pay | Admitting: Internal Medicine

## 2011-12-28 NOTE — Telephone Encounter (Signed)
Pt called stating that she would like to know the results of her xray as she is in a lot of pain.

## 2011-12-28 NOTE — Telephone Encounter (Signed)
Pt requesting result of xray

## 2012-02-13 ENCOUNTER — Other Ambulatory Visit: Payer: Self-pay | Admitting: Internal Medicine

## 2012-02-13 DIAGNOSIS — Z1231 Encounter for screening mammogram for malignant neoplasm of breast: Secondary | ICD-10-CM

## 2012-02-22 ENCOUNTER — Ambulatory Visit
Admission: RE | Admit: 2012-02-22 | Discharge: 2012-02-22 | Disposition: A | Discharge status: Home / Self Care | Payer: 59 | Source: Ambulatory Visit | Attending: Internal Medicine | Admitting: Internal Medicine

## 2012-02-22 DIAGNOSIS — Z1231 Encounter for screening mammogram for malignant neoplasm of breast: Secondary | ICD-10-CM

## 2012-02-23 ENCOUNTER — Other Ambulatory Visit: Payer: Self-pay | Admitting: Internal Medicine

## 2012-02-23 DIAGNOSIS — N644 Mastodynia: Secondary | ICD-10-CM

## 2012-02-29 ENCOUNTER — Ambulatory Visit
Admission: RE | Admit: 2012-02-29 | Discharge: 2012-02-29 | Disposition: A | Discharge status: Home / Self Care | Payer: 59 | Source: Ambulatory Visit | Attending: Internal Medicine | Admitting: Internal Medicine

## 2012-02-29 ENCOUNTER — Other Ambulatory Visit: Payer: Self-pay | Admitting: Internal Medicine

## 2012-02-29 DIAGNOSIS — N644 Mastodynia: Secondary | ICD-10-CM

## 2012-03-25 ENCOUNTER — Other Ambulatory Visit: Payer: 59

## 2012-04-01 ENCOUNTER — Ambulatory Visit (INDEPENDENT_AMBULATORY_CARE_PROVIDER_SITE_OTHER): Payer: 59 | Admitting: Internal Medicine

## 2012-04-01 ENCOUNTER — Encounter: Payer: Self-pay | Admitting: Internal Medicine

## 2012-04-01 VITALS — BP 112/70 | HR 84 | Temp 98.0°F | Resp 18 | Ht 66.0 in | Wt 261.0 lb

## 2012-04-01 DIAGNOSIS — E669 Obesity, unspecified: Secondary | ICD-10-CM | POA: Insufficient documentation

## 2012-04-01 DIAGNOSIS — Z720 Tobacco use: Secondary | ICD-10-CM

## 2012-04-01 DIAGNOSIS — Z0000 Encounter for general adult medical examination without abnormal findings: Secondary | ICD-10-CM

## 2012-04-01 DIAGNOSIS — Z23 Encounter for immunization: Secondary | ICD-10-CM

## 2012-04-01 DIAGNOSIS — F17200 Nicotine dependence, unspecified, uncomplicated: Secondary | ICD-10-CM

## 2012-04-01 NOTE — Progress Notes (Signed)
Subjective:    Patient ID: Patricia [name], female    DOB: 13-Jul-1965, 46 y.o.   MRN: [id number]  HPI  46 year old patient seen today for a health maintenance exam. She is asymptomatic. She did have a hysterectomy for benign disease in 2010  Past medical history medical problems include exogenous obesity and ongoing tobacco use. She has cut her tobacco consumption dramatically and is using an electronic cigarette. Social history she's had breast reduction surgery she's had gallbladder surgery in 1993 and had a hysterectomy in 2010  No other hospital admissions Family history father age 83 has diabetes Mother age 51 history of macular degeneration 3 brothers 2 sisters- one brother died of complications of a staph infection and had a history of mental retardation  No past medical history on file.  History   Social History  . Marital Status: Married    Spouse Name: N/A    Number of Children: N/A  . Years of Education: N/A   Occupational History  . Not on file.   Social History Main Topics  . Smoking status: Current Every Day Smoker -- 0.3 packs/day    Types: Cigarettes  . Smokeless tobacco: Never Used   Comment: using electronic cigarettes too  . Alcohol Use: Yes  . Drug Use: No  . Sexually Active: Not on file   Other Topics Concern  . Not on file   Social History Narrative  . No narrative on file    No past surgical history on file.  No family history on file.  No Known Allergies  Current Outpatient Prescriptions on File Prior to Visit  Medication Sig Dispense Refill  . cyclobenzaprine (FLEXERIL) 10 MG tablet Take 1 tablet (10 mg total) by mouth every 8 (eight) hours as needed for muscle spasms.  30 tablet  1  . ibuprofen (ADVIL,MOTRIN) 200 MG tablet Take 200 mg by mouth every 6 (six) hours as needed.          BP 112/70  Pulse 84  Temp 98 F (36.7 C) (Oral)  Resp 18  Ht 5\' 6"  (1.676 m)  Wt 261 lb (118.389 kg)  BMI 42.13 kg/m2  SpO2  99%       Review of Systems  Constitutional: Negative for fever, appetite change, fatigue and unexpected weight change.  HENT: Negative for hearing loss, ear pain, nosebleeds, congestion, sore throat, mouth sores, trouble swallowing, neck stiffness, dental problem, voice change, sinus pressure and tinnitus.   Eyes: Negative for photophobia, pain, redness and visual disturbance.  Respiratory: Negative for cough, chest tightness and shortness of breath.   Cardiovascular: Negative for chest pain, palpitations and leg swelling.  Gastrointestinal: Negative for nausea, vomiting, abdominal pain, diarrhea, constipation, blood in stool, abdominal distention and rectal pain.  Genitourinary: Negative for dysuria, urgency, frequency, hematuria, flank pain, vaginal bleeding, vaginal discharge, difficulty urinating, genital sores, vaginal pain, menstrual problem and pelvic pain.  Musculoskeletal: Negative for back pain and arthralgias.  Skin: Negative for rash.  Neurological: Negative for dizziness, syncope, speech difficulty, weakness, light-headedness, numbness and headaches.  Hematological: Negative for adenopathy. Does not bruise/bleed easily.  Psychiatric/Behavioral: Negative for suicidal ideas, behavioral problems, self-injury, dysphoric mood and agitation. The patient is not nervous/anxious.        Objective:   Physical Exam  Constitutional: She is oriented to person, place, and time. She appears well-developed and well-nourished.  HENT:  Head: Normocephalic and atraumatic.  Right Ear: External ear normal.  Left Ear: External ear normal.  Mouth/Throat: Oropharynx is clear  and moist.  Eyes: Conjunctivae normal and EOM are normal.  Neck: Normal range of motion. Neck supple. No JVD present. No thyromegaly present.  Cardiovascular: Normal rate, regular rhythm, normal heart sounds and intact distal pulses.   No murmur heard. Pulmonary/Chest: Effort normal and breath sounds normal. She has no  wheezes. She has no rales.       Status post breast reduction surgery  Abdominal: Soft. Bowel sounds are normal. She exhibits no distension and no mass. There is no tenderness. There is no rebound and no guarding.  Genitourinary: Vagina normal. Guaiac negative stool. No vaginal discharge found.       Status post hysterectomy  Musculoskeletal: Normal range of motion. She exhibits no edema and no tenderness.  Neurological: She is alert and oriented to person, place, and time. She has normal reflexes. No cranial nerve deficit. She exhibits normal muscle tone. Coordination normal.  Skin: Skin is warm and dry. No rash noted.  Psychiatric: She has a normal mood and affect. Her behavior is normal.          Assessment & Plan:   Preventive health examination. We'll check a random blood sugar Exogenous obesity Tobacco abuse. We'll continue efforts at total smoking cessation  Weight loss encouraged Recheck 1 year

## 2012-04-01 NOTE — Patient Instructions (Signed)
You need to lose weight.  Consider a lower calorie diet and regular exercise.    It is important that you exercise regularly, at least 20 minutes 3 to 4 times per week.  If you develop chest pain or shortness of breath seek  medical attention.  Smoking tobacco is very bad for your health. You should stop smoking immediately.  Return in one year for follow-up

## 2013-01-13 ENCOUNTER — Encounter: Payer: Self-pay | Admitting: Family Medicine

## 2013-01-13 ENCOUNTER — Ambulatory Visit (INDEPENDENT_AMBULATORY_CARE_PROVIDER_SITE_OTHER): Payer: 59 | Admitting: Family Medicine

## 2013-01-13 VITALS — BP 150/98 | Temp 98.6°F | Wt 267.0 lb

## 2013-01-13 DIAGNOSIS — M25551 Pain in right hip: Secondary | ICD-10-CM

## 2013-01-13 DIAGNOSIS — M25559 Pain in unspecified hip: Secondary | ICD-10-CM

## 2013-01-13 MED ORDER — TRAMADOL HCL 50 MG PO TABS: 50.0000 mg | ORAL_TABLET | Freq: Three times a day (TID) | ORAL | Status: DC | PRN

## 2013-01-13 NOTE — Progress Notes (Signed)
Chief Complaint  Patient presents with  . right hip pain    HPI:  47 yo F pt of Dr. Amador Cunas here for acute visit for hip pain: -R hip pain started 2 days ago when moved weird getting out of bed -lateral hip achy pain worse when lying on that side, or stand on this leg for a long time, occ tingling lateral R leg -denies: fevers, malaise,chills, weakness, numbness, bowel or bladder dysfunction  ROS: See pertinent positives and negatives per HPI.  No past medical history on file.  No family history on file.  History   Social History  . Marital Status: Married    Spouse Name: N/A    Number of Children: N/A  . Years of Education: N/A   Social History Main Topics  . Smoking status: Current Every Day Smoker -- 0.30 packs/day    Types: Cigarettes  . Smokeless tobacco: Never Used     Comment: using electronic cigarettes too  . Alcohol Use: Yes  . Drug Use: No  . Sexually Active: None   Other Topics Concern  . None   Social History Narrative  . None    Current outpatient prescriptions:ibuprofen (ADVIL,MOTRIN) 200 MG tablet, Take 200 mg by mouth every 6 (six) hours as needed.  , Disp: , Rfl: ;  traMADol (ULTRAM) 50 MG tablet, Take 1 tablet (50 mg total) by mouth every 8 (eight) hours as needed for pain., Disp: 15 tablet, Rfl: 0  EXAM:  Filed Vitals:   01/13/13 1609  BP: 150/98  Temp: 98.6 F (37 C)    Body mass index is 43.12 kg/(m^2).  GENERAL: vitals reviewed and listed above, alert, oriented, appears well hydrated and in no acute distress  HEENT: atraumatic, conjunttiva clear, no obvious abnormalities on inspection of external nose and ears  NECK: no obvious masses on inspection  LUNGS: clear to auscultation bilaterally, no wheezes, rales or rhonchi, good air movement  CV: HRRR, no peripheral edema  MS: moves all extremities without noticeable abnormality -normal gait -Normal Gait Normal inspection of back, no obvious scoliosis or leg length  descrepancy No bony TTP Soft tissue TTP at: R lateral hip along IT band superiorly -/+ tests: neg trendelenburg,-facet loading, -SLRT, -CLRT, -FABER, -FADIR, +Ober R Normal muscle strength, sensation to light touch and DTRs in LEs bilaterally  PSYCH: pleasant and cooperative, no obvious depression or anxiety  ASSESSMENT AND PLAN:  Discussed the following assessment and plan:  Right hip pain - Plan: traMADol (ULTRAM) 50 MG tablet  -suspect R IT band strain versus bursitis; discussed possibility of radicular symptoms as well - but less likely per exam and no weakness, loss of sensation or dtrs; discussed options; will do trial conservative tx, follow up and return precuations; HEP provided -Patient advised to return or notify a doctor immediately if symptoms worsen or persist or new concerns arise.  Patient Instructions  -tylenol or ibuprofen as needed for pain  -ice or heat to R hip twice daily for 15 minutes  -home exercises at least 4 times per week  -tramadol only if ice, tylenol or ibuprofen not working  -follow up in 3-4 weeks      KIM, HANNAH R.

## 2013-01-13 NOTE — Patient Instructions (Signed)
-  tylenol or ibuprofen as needed for pain  -ice or heat to R hip twice daily for 15 minutes  -home exercises at least 4 times per week  -tramadol only if ice, tylenol or ibuprofen not working  -follow up in 3-4 weeks

## 2013-01-15 ENCOUNTER — Telehealth: Payer: Self-pay | Admitting: Internal Medicine

## 2013-01-15 NOTE — Telephone Encounter (Signed)
Appt made/ pt aware  

## 2013-01-15 NOTE — Telephone Encounter (Signed)
Dr Kirtland Bouchard, pt was seen for acute R hip pain on Monday by Dr Selena Batten in your absence. Was rx'd Tramadol - with no relief. Pt is interested in getting a cortisone injection. She wants to know if you are ok with this and would either do the injectio or refer to Ortho to have it done. Please advise and enter referral if appropriate. Thank you.

## 2013-01-15 NOTE — Telephone Encounter (Signed)
We could do an  injection here in the office if it was felt the patient had a greater trochanteric bursitis

## 2013-01-16 ENCOUNTER — Encounter: Payer: Self-pay | Admitting: Internal Medicine

## 2013-01-16 ENCOUNTER — Ambulatory Visit (INDEPENDENT_AMBULATORY_CARE_PROVIDER_SITE_OTHER): Payer: 59 | Admitting: Internal Medicine

## 2013-01-16 VITALS — BP 140/90 | HR 96 | Temp 98.6°F | Resp 20 | Wt 270.0 lb

## 2013-01-16 DIAGNOSIS — M7061 Trochanteric bursitis, right hip: Secondary | ICD-10-CM

## 2013-01-16 DIAGNOSIS — M76899 Other specified enthesopathies of unspecified lower limb, excluding foot: Secondary | ICD-10-CM

## 2013-01-16 NOTE — Progress Notes (Signed)
  Subjective:    Patient ID: Patricia [name], female    DOB: [birth year], 47 y.o.   MRN: [id number]  HPI  47 year old patient who is seen today with a chief complaint of persistent pain in the right lateral hip area this began 5 days ago. Pain has been quite intense and refractory to analgesics and anti-inflammatory medications. The patient has point tenderness over the right lateral upper leg region  History reviewed. No pertinent past medical history.  History   Social History  . Marital Status: Married    Spouse Name: N/A    Number of Children: N/A  . Years of Education: N/A   Occupational History  . Not on file.   Social History Main Topics  . Smoking status: Current Every Day Smoker -- 0.30 packs/day    Types: Cigarettes  . Smokeless tobacco: Never Used     Comment: using electronic cigarettes too  . Alcohol Use: Yes  . Drug Use: No  . Sexually Active: Not on file   Other Topics Concern  . Not on file   Social History Narrative  . No narrative on file    History reviewed. No pertinent past surgical history.  No family history on file.  No Known Allergies  Current Outpatient Prescriptions on File Prior to Visit  Medication Sig Dispense Refill  . ibuprofen (ADVIL,MOTRIN) 200 MG tablet Take 200 mg by mouth every 6 (six) hours as needed.        . traMADol (ULTRAM) 50 MG tablet Take 1 tablet (50 mg total) by mouth every 8 (eight) hours as needed for pain.  15 tablet  0   No current facility-administered medications on file prior to visit.    BP 140/90  Pulse 96  Temp(Src) 98.6 F (37 C) (Oral)  Resp 20  Wt 270 lb (122.471 kg)  BMI 43.6 kg/m2  SpO2 98%       Review of Systems  Musculoskeletal: Positive for arthralgias and gait problem.       Objective:   Physical Exam  Musculoskeletal:  Point tenderness over the right lateral hip area          Assessment & Plan:   Right greater trochanteric bursitis  Procedure note. After proper prep,  1 cc (80 mg) of Depo-Medrol injected about the right greater trochanteric bursa along with 4 cc of Xylocaine. Patient noted the immediate benefit of the pain. Patient tolerated the procedure well

## 2013-01-16 NOTE — Patient Instructions (Addendum)

## 2013-03-24 ENCOUNTER — Other Ambulatory Visit: Payer: Self-pay

## 2013-03-24 DIAGNOSIS — Z1231 Encounter for screening mammogram for malignant neoplasm of breast: Secondary | ICD-10-CM

## 2013-03-25 ENCOUNTER — Other Ambulatory Visit (INDEPENDENT_AMBULATORY_CARE_PROVIDER_SITE_OTHER): Payer: 59

## 2013-03-25 DIAGNOSIS — Z0000 Encounter for general adult medical examination without abnormal findings: Secondary | ICD-10-CM

## 2013-03-25 LAB — LIPID PANEL: Cholesterol: 153 mg/dL (ref 0–200)

## 2013-03-25 LAB — HEPATIC FUNCTION PANEL
Albumin: 3.4 g/dL — ABNORMAL LOW (ref 3.5–5.2)
Bilirubin, Direct: 0.1 mg/dL (ref 0.0–0.3)
Total Protein: 6.4 g/dL (ref 6.0–8.3)

## 2013-03-25 LAB — CBC WITH DIFFERENTIAL/PLATELET
Basophils Relative: 0.6 % (ref 0.0–3.0)
Eosinophils Absolute: 0.2 10*3/uL (ref 0.0–0.7)
MCHC: 33.1 g/dL (ref 30.0–36.0)
MCV: 92 fl (ref 78.0–100.0)
Monocytes Absolute: 0.5 10*3/uL (ref 0.1–1.0)
Neutrophils Relative %: 48 % (ref 43.0–77.0)
RBC: 4.02 Mil/uL (ref 3.87–5.11)

## 2013-03-25 LAB — POCT URINALYSIS DIPSTICK
Leukocytes, UA: NEGATIVE
Nitrite, UA: NEGATIVE
Protein, UA: NEGATIVE
Urobilinogen, UA: 0.2

## 2013-03-25 LAB — BASIC METABOLIC PANEL
BUN: 12 mg/dL (ref 6–23)
CO2: 28 mEq/L (ref 19–32)
Chloride: 108 mEq/L (ref 96–112)
Creatinine, Ser: 0.8 mg/dL (ref 0.4–1.2)
Glucose, Bld: 88 mg/dL (ref 70–99)

## 2013-04-08 ENCOUNTER — Ambulatory Visit (INDEPENDENT_AMBULATORY_CARE_PROVIDER_SITE_OTHER): Payer: 59 | Admitting: Internal Medicine

## 2013-04-08 ENCOUNTER — Encounter: Payer: Self-pay | Admitting: Internal Medicine

## 2013-04-08 VITALS — BP 140/86 | HR 70 | Temp 98.7°F | Resp 20 | Ht 65.75 in | Wt 264.0 lb

## 2013-04-08 DIAGNOSIS — Z23 Encounter for immunization: Secondary | ICD-10-CM

## 2013-04-08 DIAGNOSIS — Z720 Tobacco use: Secondary | ICD-10-CM

## 2013-04-08 DIAGNOSIS — R609 Edema, unspecified: Secondary | ICD-10-CM

## 2013-04-08 DIAGNOSIS — E669 Obesity, unspecified: Secondary | ICD-10-CM

## 2013-04-08 DIAGNOSIS — Z0000 Encounter for general adult medical examination without abnormal findings: Secondary | ICD-10-CM

## 2013-04-08 MED ORDER — FUROSEMIDE 40 MG PO TABS: 40.0000 mg | ORAL_TABLET | Freq: Every day | ORAL | Status: DC

## 2013-04-08 NOTE — Patient Instructions (Signed)
Limit your sodium (Salt) intake    It is important that you exercise regularly, at least 20 minutes 3 to 4 times per week.  If you develop chest pain or shortness of breath seek  medical attention.  You need to lose weight.  Consider a lower calorie diet and regular exercise.  Smoking tobacco is very bad for your health. You should stop smoking immediately.  Return in one year for follow-up  Schedule your mammogram.

## 2013-04-08 NOTE — Progress Notes (Signed)
Subjective:    Patient ID: Patricia [name], female    DOB: 1965/07/10, 47 y.o.   MRN: [id number]  HPI  Wt Readings from Last 3 Encounters:  04/08/13 264 lb (119.75 kg)  01/16/13 270 lb (122.471 kg)  01/13/13 267 lb (121.11 kg)    Review of Systems  Cardiovascular:       Intermittent pedal edema       Objective:   Physical Exam  Musculoskeletal: She exhibits edema.  +1 pedal edema    See below Pelvic exam not repeated      Assessment & Plan:  Preventive health exam  Subjective:    Patient ID: Patricia [name], female    DOB: 1965/11/18, 47 y.o.   MRN: [id number]  HPI  47 year old patient seen today for a health maintenance exam. She is asymptomatic. She did have a hysterectomy for benign disease in 2010  Past medical history medical problems include exogenous obesity and ongoing tobacco use. She has cut her tobacco consumption dramatically and is using an electronic cigarette. Social history she's had breast reduction surgery she's had gallbladder surgery in 1993 and had a hysterectomy in 2010  No other hospital admissions Family history father age 78 has diabetes Mother age 58 history of macular degeneration 3 brothers 2 sisters- one brother died of complications of a staph infection and had a history of mental retardation  History reviewed. No pertinent past medical history.  History   Social History  . Marital Status: Married    Spouse Name: N/A    Number of Children: N/A  . Years of Education: N/A   Occupational History  . Not on file.   Social History Main Topics  . Smoking status: Current Every Day Smoker -- 0.30 packs/day    Types: Cigarettes  . Smokeless tobacco: Never Used     Comment: using electronic cigarettes too  . Alcohol Use: Yes  . Drug Use: No  . Sexual Activity: Not on file   Other Topics Concern  . Not on file   Social History Narrative  . No narrative on file    History reviewed. No pertinent past surgical  history.  No family history on file.  No Known Allergies  Current Outpatient Prescriptions on File Prior to Visit  Medication Sig Dispense Refill  . ibuprofen (ADVIL,MOTRIN) 200 MG tablet Take 200 mg by mouth every 6 (six) hours as needed.         No current facility-administered medications on file prior to visit.    BP 140/86  Pulse 70  Temp(Src) 98.7 F (37.1 C) (Oral)  Resp 20  Ht 5' 5.75" (1.67 m)  Wt 264 lb (119.75 kg)  BMI 42.94 kg/m2  SpO2 98%       Review of Systems  Constitutional: Negative for fever, appetite change, fatigue and unexpected weight change.  HENT: Negative for hearing loss, ear pain, nosebleeds, congestion, sore throat, mouth sores, trouble swallowing, neck stiffness, dental problem, voice change, sinus pressure and tinnitus.   Eyes: Negative for photophobia, pain, redness and visual disturbance.  Respiratory: Negative for cough, chest tightness and shortness of breath.   Cardiovascular: Negative for chest pain, palpitations and leg swelling.  Gastrointestinal: Negative for nausea, vomiting, abdominal pain, diarrhea, constipation, blood in stool, abdominal distention and rectal pain.  Genitourinary: Negative for dysuria, urgency, frequency, hematuria, flank pain, vaginal bleeding, vaginal discharge, difficulty urinating, genital sores, vaginal pain, menstrual problem and pelvic pain.  Musculoskeletal: Negative for back pain and arthralgias.  Skin: Negative  for rash.  Neurological: Negative for dizziness, syncope, speech difficulty, weakness, light-headedness, numbness and headaches.  Hematological: Negative for adenopathy. Does not bruise/bleed easily.  Psychiatric/Behavioral: Negative for suicidal ideas, behavioral problems, self-injury, dysphoric mood and agitation. The patient is not nervous/anxious.        Objective:   Physical Exam  Constitutional: She is oriented to person, place, and time. She appears well-developed and well-nourished.   HENT:  Head: Normocephalic and atraumatic.  Right Ear: External ear normal.  Left Ear: External ear normal.  Mouth/Throat: Oropharynx is clear and moist.  Eyes: Conjunctivae normal and EOM are normal.  Neck: Normal range of motion. Neck supple. No JVD present. No thyromegaly present.  Cardiovascular: Normal rate, regular rhythm, normal heart sounds and intact distal pulses.   No murmur heard. Pulmonary/Chest: Effort normal and breath sounds normal. She has no wheezes. She has no rales.       Status post breast reduction surgery  Abdominal: Soft. Bowel sounds are normal. She exhibits no distension and no mass. There is no tenderness. There is no rebound and no guarding.  Genitourinary: Vagina normal. Guaiac negative stool. No vaginal discharge found.       Status post hysterectomy  Musculoskeletal: Normal range of motion. She exhibits no edema and no tenderness.  Neurological: She is alert and oriented to person, place, and time. She has normal reflexes. No cranial nerve deficit. She exhibits normal muscle tone. Coordination normal.  Skin: Skin is warm and dry. No rash noted.  Psychiatric: She has a normal mood and affect. Her behavior is normal.          Assessment & Plan:   Preventive health examination. We'll check a random blood sugar Exogenous obesity Tobacco abuse. We'll continue efforts at total smoking cessation  Weight loss encouraged Recheck 1 year

## 2013-04-09 ENCOUNTER — Ambulatory Visit
Admission: RE | Admit: 2013-04-09 | Discharge: 2013-04-09 | Disposition: A | Discharge status: Home / Self Care | Payer: 59 | Source: Ambulatory Visit

## 2013-04-09 DIAGNOSIS — Z1231 Encounter for screening mammogram for malignant neoplasm of breast: Secondary | ICD-10-CM

## 2013-05-30 ENCOUNTER — Ambulatory Visit: Payer: 59 | Admitting: Family Medicine

## 2013-05-30 DIAGNOSIS — Z0289 Encounter for other administrative examinations: Secondary | ICD-10-CM

## 2014-08-12 ENCOUNTER — Ambulatory Visit: Payer: Self-pay | Admitting: Family Medicine

## 2014-11-26 ENCOUNTER — Telehealth: Payer: Self-pay | Admitting: Internal Medicine

## 2014-11-26 NOTE — Telephone Encounter (Signed)
Pt called in regarding pain in her knee that she is concerned about. Would like to be called back about advice on that.

## 2014-11-26 NOTE — Telephone Encounter (Signed)
Pt has been scheduled.  °

## 2014-11-26 NOTE — Telephone Encounter (Signed)
Please call pt and schedule appointment for knee pain. Pt has not been seen since 2014. Also needs physical.

## 2014-11-27 ENCOUNTER — Encounter: Payer: Self-pay | Admitting: Internal Medicine

## 2014-11-27 ENCOUNTER — Ambulatory Visit (INDEPENDENT_AMBULATORY_CARE_PROVIDER_SITE_OTHER): Payer: 59 | Admitting: Internal Medicine

## 2014-11-27 ENCOUNTER — Ambulatory Visit: Payer: Self-pay | Admitting: Internal Medicine

## 2014-11-27 VITALS — BP 120/82 | HR 102 | Temp 99.4°F | Resp 20 | Ht 65.75 in | Wt 271.0 lb

## 2014-11-27 DIAGNOSIS — R609 Edema, unspecified: Secondary | ICD-10-CM

## 2014-11-27 DIAGNOSIS — M25562 Pain in left knee: Secondary | ICD-10-CM | POA: Diagnosis not present

## 2014-11-27 MED ORDER — MELOXICAM 15 MG PO TABS: 15.0000 mg | ORAL_TABLET | Freq: Every day | ORAL | Status: DC

## 2014-11-27 NOTE — Progress Notes (Signed)
Pre visit review using our clinic review tool, if applicable. No additional management support is needed unless otherwise documented below in the visit note. 

## 2014-11-27 NOTE — Progress Notes (Unsigned)
   Subjective:    Patient ID: Patricia [name], female    DOB: November 28, 1965, 49 y.o.   MRN: [id number]  HPI    Review of Systems     Objective:   Physical Exam        Assessment & Plan:

## 2014-11-27 NOTE — Progress Notes (Signed)
   Subjective:    Patient ID: Patricia [name], female    DOB: [birth year], 49 y.o.   MRN: [id number]  HPI  49 year old patient who has a history of exogenous obesity.  She takes furosemide for lower extremity edema when necessary.  She's had a prior history of left knee arthroscopic surgery.  Since returning from a trip to [place], she has had left knee pain for the past 5 days.  No history of trauma.  No past medical history on file.  History   Social History  . Marital Status: Married    Spouse Name: N/A  . Number of Children: N/A  . Years of Education: N/A   Occupational History  . Not on file.   Social History Main Topics  . Smoking status: Current Every Day Smoker -- 0.30 packs/day    Types: Cigarettes  . Smokeless tobacco: Never Used     Comment: using electronic cigarettes too  . Alcohol Use: Yes  . Drug Use: No  . Sexual Activity: Not on file   Other Topics Concern  . Not on file   Social History Narrative    No past surgical history on file.  No family history on file.  No Known Allergies  Current Outpatient Prescriptions on File Prior to Visit  Medication Sig Dispense Refill  . furosemide (LASIX) 40 MG tablet Take 1 tablet (40 mg total) by mouth daily. 30 tablet 3  . ibuprofen (ADVIL,MOTRIN) 200 MG tablet Take 200 mg by mouth every 6 (six) hours as needed.       No current facility-administered medications on file prior to visit.    BP 120/82 mmHg  Pulse 102  Temp(Src) 99.4 F (37.4 C) (Oral)  Resp 20  Ht 5' 5.75" (1.67 m)  Wt 271 lb (122.925 kg)  BMI 44.08 kg/m2  SpO2 96%     Review of Systems  Constitutional: Negative.   HENT: Negative for congestion, dental problem, hearing loss, rhinorrhea, sinus pressure, sore throat and tinnitus.   Eyes: Negative for pain, discharge and visual disturbance.  Respiratory: Negative for cough and shortness of breath.   Cardiovascular: Positive for leg swelling. Negative for chest pain and  palpitations.  Gastrointestinal: Negative for nausea, vomiting, abdominal pain, diarrhea, constipation, blood in stool and abdominal distention.  Genitourinary: Negative for dysuria, urgency, frequency, hematuria, flank pain, vaginal bleeding, vaginal discharge, difficulty urinating, vaginal pain and pelvic pain.  Musculoskeletal: Positive for arthralgias. Negative for joint swelling and gait problem.  Skin: Negative for rash.  Neurological: Negative for dizziness, syncope, speech difficulty, weakness, numbness and headaches.  Hematological: Negative for adenopathy.  Psychiatric/Behavioral: Negative for behavioral problems, dysphoric mood and agitation. The patient is not nervous/anxious.        Objective:   Physical Exam  Constitutional: She appears well-developed and well-nourished. No distress.  Repeat blood pressure 120/82 Pulse 80-90  Musculoskeletal:  Left knee slightly warm to touch compared to the right  Mild ankle and peel edema bilateral          Assessment & Plan:    Left knee pain status post left arthroscopic knee surgery for meniscal tear Pedal edema Obesity  We'll treat with meloxicam elevation.  Should minimize her activity over the weekend and try to ice the knee 2 or 3 times per day.  If unimproved, will recontact her orthopedic physician

## 2014-11-27 NOTE — Patient Instructions (Signed)
You  may move around, but avoid painful motions and activities.  Apply ice to the sore area for 15 to 20 minutes 3 or 4 times daily for the next two to 3 days.  Call or return to clinic prn if these symptoms worsen or fail to improve as anticipated.  

## 2014-12-24 ENCOUNTER — Other Ambulatory Visit: Payer: Self-pay

## 2014-12-31 ENCOUNTER — Encounter: Payer: Self-pay | Admitting: Internal Medicine

## 2015-01-01 ENCOUNTER — Other Ambulatory Visit (INDEPENDENT_AMBULATORY_CARE_PROVIDER_SITE_OTHER): Payer: 59

## 2015-01-01 DIAGNOSIS — Z0000 Encounter for general adult medical examination without abnormal findings: Secondary | ICD-10-CM | POA: Diagnosis not present

## 2015-01-01 LAB — BASIC METABOLIC PANEL
BUN: 12 mg/dL (ref 6–23)
CALCIUM: 9.2 mg/dL (ref 8.4–10.5)
CO2: 28 mEq/L (ref 19–32)
CREATININE: 0.81 mg/dL (ref 0.40–1.20)
Chloride: 106 mEq/L (ref 96–112)
GFR: 97.09 mL/min (ref >60.00)
GLUCOSE: 95 mg/dL (ref 70–99)
POTASSIUM: 4 meq/L (ref 3.5–5.1)
Sodium: 142 mEq/L (ref 135–145)

## 2015-01-01 LAB — CBC WITH DIFFERENTIAL/PLATELET
BASOS ABS: 0 10*3/uL (ref 0.0–0.1)
Basophils Relative: 0.5 % (ref 0.0–3.0)
EOS PCT: 5.4 % — AB (ref 0.0–5.0)
Eosinophils Absolute: 0.4 10*3/uL (ref 0.0–0.7)
HCT: 40.3 % (ref 36.0–46.0)
Hemoglobin: 13.3 g/dL (ref 12.0–15.0)
Lymphocytes Relative: 38 % (ref 12.0–46.0)
Lymphs Abs: 2.9 10*3/uL (ref 0.7–4.0)
MCHC: 33 g/dL (ref 30.0–36.0)
MCV: 91.3 fl (ref 78.0–100.0)
MONO ABS: 0.5 10*3/uL (ref 0.1–1.0)
Monocytes Relative: 6.5 % (ref 3.0–12.0)
Neutro Abs: 3.8 10*3/uL (ref 1.4–7.7)
Neutrophils Relative %: 49.6 % (ref 43.0–77.0)
PLATELETS: 356 10*3/uL (ref 150.0–400.0)
RBC: 4.41 Mil/uL (ref 3.87–5.11)
RDW: 14.4 % (ref 11.5–15.5)
WBC: 7.7 10*3/uL (ref 4.0–10.5)

## 2015-01-01 LAB — POCT URINALYSIS DIPSTICK
Bilirubin, UA: NEGATIVE
Blood, UA: NEGATIVE
Glucose, UA: NEGATIVE
Ketones, UA: NEGATIVE
Leukocytes, UA: NEGATIVE
Nitrite, UA: POSITIVE
PH UA: 5.5
Protein, UA: NEGATIVE
SPEC GRAV UA: 1.025
UROBILINOGEN UA: 0.2

## 2015-01-01 LAB — HEPATIC FUNCTION PANEL
ALT: 14 U/L (ref 0–35)
AST: 14 U/L (ref 0–37)
Albumin: 3.8 g/dL (ref 3.5–5.2)
Alkaline Phosphatase: 67 U/L (ref 39–117)
BILIRUBIN DIRECT: 0.1 mg/dL (ref 0.0–0.3)
TOTAL PROTEIN: 6.5 g/dL (ref 6.0–8.3)
Total Bilirubin: 0.4 mg/dL (ref 0.2–1.2)

## 2015-01-01 LAB — LIPID PANEL
CHOL/HDL RATIO: 3
Cholesterol: 167 mg/dL (ref 0–200)
HDL: 49.1 mg/dL (ref >39.00)
LDL CALC: 103 mg/dL — AB (ref 0–99)
NONHDL: 117.9
TRIGLYCERIDES: 77 mg/dL (ref 0.0–149.0)
VLDL: 15.4 mg/dL (ref 0.0–40.0)

## 2015-01-01 LAB — TSH: TSH: 1.28 u[IU]/mL (ref 0.35–4.50)

## 2015-01-08 ENCOUNTER — Encounter: Payer: Self-pay | Admitting: Internal Medicine

## 2015-01-08 ENCOUNTER — Ambulatory Visit (INDEPENDENT_AMBULATORY_CARE_PROVIDER_SITE_OTHER): Payer: 59 | Admitting: Internal Medicine

## 2015-01-08 VITALS — BP 130/80 | HR 67 | Temp 98.7°F | Resp 20 | Ht 67.0 in | Wt 275.0 lb

## 2015-01-08 DIAGNOSIS — Z0000 Encounter for general adult medical examination without abnormal findings: Secondary | ICD-10-CM

## 2015-01-08 DIAGNOSIS — E669 Obesity, unspecified: Secondary | ICD-10-CM | POA: Diagnosis not present

## 2015-01-08 DIAGNOSIS — Z720 Tobacco use: Secondary | ICD-10-CM | POA: Diagnosis not present

## 2015-01-08 MED ORDER — VARENICLINE TARTRATE 1 MG PO TABS: 1.0000 mg | ORAL_TABLET | Freq: Two times a day (BID) | ORAL | Status: DC

## 2015-01-08 MED ORDER — VARENICLINE TARTRATE 0.5 MG X 11 & 1 MG X 42 PO MISC: ORAL | Status: DC

## 2015-01-08 NOTE — Progress Notes (Signed)
Subjective:    Patient ID: Patricia [name], female    DOB: 10/11/1965, 49 y.o.   MRN: [id number]  HPI   Wt Readings from Last 3 Encounters:  01/08/15 275 lb (124.739 kg)  11/27/14 271 lb (122.925 kg)  04/08/13 264 lb (119.75 kg)    Review of Systems  Cardiovascular:       Intermittent pedal edema       Objective:   Physical Exam  Musculoskeletal: She exhibits edema.  +1 pedal edema    See below Pelvic exam not repeated      Assessment & Plan:  Preventive health exam  Subjective:    Patient ID: Patricia [name], female    DOB: 05/18/1966, 49 y.o.   MRN: [id number]  HPI  49 year old patient seen today for a health maintenance exam. She is asymptomatic. She did have a hysterectomy for benign disease in 2010  Past medical history medical problems include exogenous obesity and ongoing tobacco use.  She's had breast reduction surgery she's had gallbladder surgery in 1993 and had a hysterectomy in 2010  No other hospital admissions Family history father age 39 has diabetes Mother age 83 history of macular degeneration 3 brothers 2 sisters- one brother died of complications of a staph infection and had a history of mental retardation  No past medical history on file.  History   Social History  . Marital Status: Married    Spouse Name: N/A  . Number of Children: N/A  . Years of Education: N/A   Occupational History  . Not on file.   Social History Main Topics  . Smoking status: Current Every Day Smoker -- 0.30 packs/day    Types: Cigarettes  . Smokeless tobacco: Never Used     Comment: using electronic cigarettes too  . Alcohol Use: Yes  . Drug Use: No  . Sexual Activity: Not on file   Other Topics Concern  . Not on file   Social History Narrative    No past surgical history on file.  No family history on file.  No Known Allergies  Current Outpatient Prescriptions on File Prior to Visit  Medication Sig Dispense Refill  . furosemide (LASIX)  40 MG tablet Take 1 tablet (40 mg total) by mouth daily. 30 tablet 3  . ibuprofen (ADVIL,MOTRIN) 200 MG tablet Take 200 mg by mouth every 6 (six) hours as needed.      . meloxicam (MOBIC) 15 MG tablet Take 1 tablet (15 mg total) by mouth daily. 30 tablet 2   No current facility-administered medications on file prior to visit.    BP 130/80 mmHg  Pulse 67  Temp(Src) 98.7 F (37.1 C) (Oral)  Resp 20  Ht 5\' 7"  (1.702 m)  Wt 275 lb (124.739 kg)  BMI 43.06 kg/m2  SpO2 98%       Review of Systems  Constitutional: Negative for fever, appetite change, fatigue and unexpected weight change.  HENT: Negative for hearing loss, ear pain, nosebleeds, congestion, sore throat, mouth sores, trouble swallowing, neck stiffness, dental problem, voice change, sinus pressure and tinnitus.   Eyes: Negative for photophobia, pain, redness and visual disturbance.  Respiratory: Negative for cough, chest tightness and shortness of breath.   Cardiovascular: Negative for chest pain, palpitations and leg swelling.  Gastrointestinal: Negative for nausea, vomiting, abdominal pain, diarrhea, constipation, blood in stool, abdominal distention and rectal pain.  Genitourinary: Negative for dysuria, urgency, frequency, hematuria, flank pain, vaginal bleeding, vaginal discharge, difficulty urinating, genital sores, vaginal pain, menstrual  problem and pelvic pain.  Musculoskeletal: Negative for back pain and arthralgias.  Skin: Negative for rash.  Neurological: Negative for dizziness, syncope, speech difficulty, weakness, light-headedness, numbness and headaches.  Hematological: Negative for adenopathy. Does not bruise/bleed easily.  Psychiatric/Behavioral: Negative for suicidal ideas, behavioral problems, self-injury, dysphoric mood and agitation. The patient is not nervous/anxious.        Objective:   Physical Exam  Constitutional: She is oriented to person, place, and time. She appears well-developed and  well-nourished.  HENT:  Head: Normocephalic and atraumatic.  Right Ear: External ear normal.  Left Ear: External ear normal.  Mouth/Throat: Oropharynx is clear and moist.  Eyes: Conjunctivae normal and EOM are normal.  Neck: Normal range of motion. Neck supple. No JVD present. No thyromegaly present.  Cardiovascular: Normal rate, regular rhythm, normal heart sounds and intact distal pulses.   No murmur heard. Pulmonary/Chest: Effort normal and breath sounds normal. She has no wheezes. She has no rales.       Status post breast reduction surgery  Abdominal: Soft. Bowel sounds are normal. She exhibits no distension and no mass. There is no tenderness. There is no rebound and no guarding.  Genitourinary:       Status post hysterectomy  Musculoskeletal: Normal range of motion. She exhibits no edema and no tenderness.  Neurological: She is alert and oriented to person, place, and time. She has normal reflexes. No cranial nerve deficit. She exhibits normal muscle tone. Coordination normal.  Skin: Skin is warm and dry. No rash noted.  Psychiatric: She has a normal mood and affect. Her behavior is normal.          Assessment & Plan:   Preventive health examination. We'll check a random blood sugar Exogenous obesity Tobacco abuse. We'll continue efforts at total smoking cessation.  Patient is interested in trying Chantix  Weight loss encouraged Recheck 1 year

## 2015-01-08 NOTE — Progress Notes (Signed)
Pre visit review using our clinic review tool, if applicable. No additional management support is needed unless otherwise documented below in the visit note. 

## 2015-01-08 NOTE — Patient Instructions (Signed)
It is important that you exercise regularly, at least 20 minutes 3 to 4 times per week.  If you develop chest pain or shortness of breath seek  medical attention.  Smoking tobacco is very bad for your health. You should stop smoking immediately.  You need to lose weight.  Consider a lower calorie diet and regular exercise.Cardiac Diet A cardiac diet can help stop heart disease or a stroke from happening. It involves eating less unhealthy fats and eating more healthy fats.  FOODS TO AVOID OR LIMIT  Limit saturated fats. This type of fat is found in oils and dairy products, such as:  Coconut oil.  Palm oil.  Cocoa butter.  Butter.  Avoid trans-fat or hydrogenated oils. These are found in fried or pre-made baked goods, such as:  Margarine.  Pre-made cookies, cakes, and crackers.  Limit processed meats (hot dogs, deli meats, sausage) to 3 ounces a week.  Limit high-fat meats (marbled meats, fried chicken, or chicken with skin) to 3 ounces a week.  Limit salt (sodium) to 1500 milligrams a day.   Limit sweets and drinks with added sugar to no more than 5 servings a week. One serving is:  1 tablespoon of sugar.  1 tablespoon of jelly or jam.   cup sorbet.  1 cup lemonade.   cup regular soda. EAT MORE OF THE FOLLOWING FOODS Fruit  Eat 4to 5 servings a day. One serving of fruit is:  1 medium whole fruit.   cup dried fruit.   cup of fresh, frozen, or canned fruit.   cup 100% fruit juice. Vegetables  Eat 4 to 5 servings a day. One serving is:  1 cup raw leafy vegetables.   cup raw or cooked, cut-up vegetables.   cup vegetable juice. Whole Grains  Eat 3 servings a day (1 ounce equals 1 serving). Legumes (such as beans, peas, and lentils)   Eat at least 4 servings a week ( cup equals 1 serving). Nuts and Seeds   Eat at least 4 servings a week ( cup equals 1 serving). Dietary Fiber  Eat 20 to 30 grams a day. Some foods high in dietary fiber  include:  Dried beans.  Citrus fruits.  Apples, bananas.  Broccoli, Brussels sprouts, and eggplant.  Oats. Omega-3 Fats  Eat food with omega-3 fats. You can also take a dietary pill (supplement) that has 1 gram of DHA and EPA. Have 3.5 ounces of fatty fish a week, such as:  Salmon.  Mackerel.  [place] tuna.  Sardines.  Lake trout.  Herring. PREPARING YOUR FOOD  Broil, bake, steam, or roast foods. Do not fry food. Do not cook food in butter (fat).  Use non-stick cooking sprays.  Remove skin from poultry, such as chicken and [place].  Remove fat from meat.  Take the fat off the top of stews, soups, and gravy.  Use lemon or herbs to flavor food instead of using butter or margarine.  Use nonfat yogurt, salsa, or low-fat dressings for salads. Document Released: 11/28/2011 Document Reviewed: 11/28/2011 [facility] Patient Information 2015 [place]. This information is not intended to replace advice given to you by your health care provider. Make sure you discuss any questions you have with your health care provider.

## 2015-01-11 ENCOUNTER — Telehealth: Payer: Self-pay | Admitting: Internal Medicine

## 2015-01-11 MED ORDER — BUPROPION HCL ER (SR) 150 MG PO TB12: 150.0000 mg | ORAL_TABLET | Freq: Two times a day (BID) | ORAL | Status: DC

## 2015-01-11 NOTE — Telephone Encounter (Signed)
150 mg #60 one twice a day refill times 3

## 2015-01-11 NOTE — Telephone Encounter (Signed)
Pt would like to try bupropion call into [facility] in [place] [address]

## 2015-01-11 NOTE — Telephone Encounter (Signed)
Per insurance, patient must try and fail or be intolerant to Bupropion before they will approve Chantix.

## 2015-01-11 NOTE — Telephone Encounter (Signed)
Please see message and advise 

## 2015-01-11 NOTE — Telephone Encounter (Signed)
Spoke to pt, told her Rx was sent to pharmacy. Pt verbalized understanding.

## 2015-02-14 ENCOUNTER — Other Ambulatory Visit: Payer: Self-pay | Admitting: Internal Medicine

## 2015-08-06 ENCOUNTER — Ambulatory Visit: Payer: Self-pay | Admitting: Internal Medicine

## 2015-11-23 ENCOUNTER — Other Ambulatory Visit: Payer: Self-pay | Admitting: Internal Medicine

## 2015-11-23 NOTE — Telephone Encounter (Signed)
Patient last seen on 01-08-15 and discussed Chantix then. Rx for starter pack sent in and given printed RX for continuing Pak.

## 2015-11-23 NOTE — Telephone Encounter (Signed)
Rx refill sent to pharmacy. 

## 2015-11-23 NOTE — Telephone Encounter (Signed)
ok 

## 2016-01-20 ENCOUNTER — Ambulatory Visit (INDEPENDENT_AMBULATORY_CARE_PROVIDER_SITE_OTHER): Payer: BLUE CROSS/BLUE SHIELD | Admitting: Family Medicine

## 2016-01-20 ENCOUNTER — Encounter: Payer: Self-pay | Admitting: Family Medicine

## 2016-01-20 VITALS — BP 132/94 | HR 90 | Temp 98.7°F | Ht 67.0 in | Wt 274.3 lb

## 2016-01-20 DIAGNOSIS — I10 Essential (primary) hypertension: Secondary | ICD-10-CM | POA: Diagnosis not present

## 2016-01-20 DIAGNOSIS — G43809 Other migraine, not intractable, without status migrainosus: Secondary | ICD-10-CM | POA: Diagnosis not present

## 2016-01-20 MED ORDER — SUMATRIPTAN SUCCINATE 25 MG PO TABS: ORAL_TABLET | ORAL | 0 refills | Status: DC

## 2016-01-20 MED ORDER — HYDROCHLOROTHIAZIDE 25 MG PO TABS: 25.0000 mg | ORAL_TABLET | Freq: Every day | ORAL | 0 refills | Status: DC

## 2016-01-20 MED ORDER — PROMETHAZINE HCL 25 MG/ML IJ SOLN
25.0000 mg | Freq: Once | INTRAMUSCULAR | Status: AC
  Administered 2016-01-20: 25 mg via INTRAMUSCULAR

## 2016-01-20 MED ORDER — KETOROLAC TROMETHAMINE 60 MG/2ML IM SOLN
60.0000 mg | Freq: Once | INTRAMUSCULAR | Status: AC
  Administered 2016-01-20: 60 mg via INTRAMUSCULAR

## 2016-01-20 NOTE — Progress Notes (Addendum)
HPI:  Patricia [name] is a pleasant 50 yo here for and acute visit for a "Migraine": -started: yesterday -reports treated for migraine a month ago with a "shot for pain and a shot for nausea" and this aborted her HA then with no migraines since until now- requesting the same -reports increased stress lately - seeing therapist and is improving -symptoms: bifrontal headache, nausea without vomiting, light and sound sensitivity -has tried: goodies once yesterday, nothing today -denies: fevers, neck stiffness, vision changes, vomiting, speech changes, weakness, numbness -hx of: tension/migraine HAs -hx hypertension - husband reports on hctz - but non-compliant per spouse, she reports she tolerated the hctz well and is agreeable to restarting as reports BP has been elevated intermittently -records from [facility] 1 month ago confirm toradol, phenergan given; BP elevated  ROS: See pertinent positives and negatives per HPI.  No past medical history on file.  No past surgical history on file.  No family history on file.  Social History   Social History  . Marital status: Married    Spouse name: N/A  . Number of children: N/A  . Years of education: N/A   Social History Main Topics  . Smoking status: Current Every Day Smoker    Packs/day: 0.30    Types: Cigarettes  . Smokeless tobacco: Never Used     Comment: using electronic cigarettes too  . Alcohol use Yes  . Drug use: No  . Sexual activity: Not Asked   Other Topics Concern  . None   Social History Narrative  . None     Current Outpatient Prescriptions:  .  buPROPion (WELLBUTRIN SR) 150 MG 12 hr tablet, Take 1 tablet (150 mg total) by mouth 2 (two) times daily., Disp: 60 tablet, Rfl: 3 .  CHANTIX CONTINUING MONTH PAK 1 MG tablet, TAKE 1 TABLET BY MOUTH TWICE DAILY, Disp: 56 tablet, Rfl: 0 .  ibuprofen (ADVIL,MOTRIN) 200 MG tablet, Take 200 mg by mouth every 6 (six) hours as needed.  , Disp: , Rfl:  .  hydrochlorothiazide  (HYDRODIURIL) 25 MG tablet, Take 1 tablet (25 mg total) by mouth daily., Disp: 30 tablet, Rfl: 0 .  SUMAtriptan (IMITREX) 25 MG tablet, Please take one tablet at the onset of a migraine. May repeat once in 2 hours if headache persists or recurs., Disp: 10 tablet, Rfl: 0  EXAM:  Vitals:   01/20/16 1513  BP: (!) 132/94  Pulse: 90  Temp: 98.7 F (37.1 C)    Body mass index is 42.96 kg/m.  GENERAL: vitals reviewed and listed above, alert, oriented, appears well hydrated and in no acute distress  HEENT: atraumatic, conjunttiva clear, PERRLA, visual acuity grossly intact, no obvious abnormalities on inspection of external nose and ears  NECK: no obvious masses on inspection  LUNGS: clear to auscultation bilaterally, no wheezes, rales or rhonchi, good air movement  CV: HRRR, no peripheral edema  MS: moves all extremities without noticeable abnormality  PSYCH/NEURO: pleasant and cooperative, no obvious depression or anxiety, gait normal, speech and thought processing grossly intact, CN II-XII grossly intact, finger to nose normal  ASSESSMENT AND PLAN:  Discussed the following assessment and plan:  Other type of migraine without status migrainosus -likely migraine given hx, symptoms, normal neuro exam Toradol and phenergan per her request and prior response after discussion risks/benefits - felt better after -has phenergan at home to use if needed -trial imitrex if another migraine occurs -discussed other causes headaches and return and emergency precautions - no signs or  symptoms other on exam today - discussed may need referral, neuroimaging if frequent or persistent and emergency eval if any signs/symptoms serious etiology should develop  Essential hypertension -discussed tx options -she prefers to stick with med she reports she took in the past hctz  Close follow up with PCP to recheck BP, ensure improving and for monitoring chronic disease, labs since starting  diuretic.  -Patient advised to return or notify a doctor immediately if symptoms worsen or persist or new concerns arise.  Patient Instructions  BEFORE YOU LEAVE: -follow up: in 1-2 weeks  Start the blood pressure medication and take daily.  Can try the Imitrex (migraine medication) per instructions.  Seek emergency care if worst headache of life, severe headache, stroke symptoms as we discussed.    Colin Benton R., DO

## 2016-01-20 NOTE — Progress Notes (Signed)
Pre visit review using our clinic review tool, if applicable. No additional management support is needed unless otherwise documented below in the visit note. 

## 2016-01-20 NOTE — Patient Instructions (Addendum)
BEFORE YOU LEAVE: -follow up: in 1-2 weeks with PCP  Start the blood pressure medication and take daily.  Can try the Imitrex (migraine medication) per instructions.  Keep headache journal per triggers handout.  Seek emergency care if worst headache of life, severe headache, stroke symptoms as we discussed.

## 2016-01-20 NOTE — Addendum Note (Signed)
Addended by: Agnes Lawrence on: 01/20/2016 04:09 PM   Modules accepted: Orders

## 2016-02-09 ENCOUNTER — Encounter (HOSPITAL_COMMUNITY): Payer: Self-pay

## 2016-02-09 ENCOUNTER — Inpatient Hospital Stay (HOSPITAL_COMMUNITY)
Admission: EM | Admit: 2016-02-09 | Discharge: 2016-02-12 | DRG: 176 | Disposition: A | Discharge status: Home / Self Care | Payer: BLUE CROSS/BLUE SHIELD | Attending: Internal Medicine | Admitting: Internal Medicine

## 2016-02-09 ENCOUNTER — Ambulatory Visit: Payer: BLUE CROSS/BLUE SHIELD | Admitting: Internal Medicine

## 2016-02-09 ENCOUNTER — Emergency Department (HOSPITAL_COMMUNITY): Payer: BLUE CROSS/BLUE SHIELD

## 2016-02-09 DIAGNOSIS — G43909 Migraine, unspecified, not intractable, without status migrainosus: Secondary | ICD-10-CM | POA: Diagnosis present

## 2016-02-09 DIAGNOSIS — I2609 Other pulmonary embolism with acute cor pulmonale: Secondary | ICD-10-CM

## 2016-02-09 DIAGNOSIS — Z6839 Body mass index (BMI) 39.0-39.9, adult: Secondary | ICD-10-CM

## 2016-02-09 DIAGNOSIS — I10 Essential (primary) hypertension: Secondary | ICD-10-CM | POA: Diagnosis present

## 2016-02-09 DIAGNOSIS — E669 Obesity, unspecified: Secondary | ICD-10-CM | POA: Diagnosis present

## 2016-02-09 DIAGNOSIS — E876 Hypokalemia: Secondary | ICD-10-CM | POA: Diagnosis present

## 2016-02-09 DIAGNOSIS — I2699 Other pulmonary embolism without acute cor pulmonale: Principal | ICD-10-CM | POA: Diagnosis present

## 2016-02-09 DIAGNOSIS — G44209 Tension-type headache, unspecified, not intractable: Secondary | ICD-10-CM | POA: Diagnosis present

## 2016-02-09 DIAGNOSIS — I272 Other secondary pulmonary hypertension: Secondary | ICD-10-CM | POA: Diagnosis present

## 2016-02-09 DIAGNOSIS — Z720 Tobacco use: Secondary | ICD-10-CM | POA: Diagnosis present

## 2016-02-09 DIAGNOSIS — D6859 Other primary thrombophilia: Secondary | ICD-10-CM | POA: Diagnosis present

## 2016-02-09 DIAGNOSIS — R55 Syncope and collapse: Secondary | ICD-10-CM | POA: Diagnosis not present

## 2016-02-09 DIAGNOSIS — F17210 Nicotine dependence, cigarettes, uncomplicated: Secondary | ICD-10-CM | POA: Diagnosis present

## 2016-02-09 HISTORY — DX: Essential (primary) hypertension: I10

## 2016-02-09 LAB — BASIC METABOLIC PANEL
Anion gap: 9 (ref 5–15)
BUN: 15 mg/dL (ref 6–20)
CHLORIDE: 105 mmol/L (ref 101–111)
CO2: 23 mmol/L (ref 22–32)
CREATININE: 1.07 mg/dL — AB (ref 0.44–1.00)
Calcium: 9.4 mg/dL (ref 8.9–10.3)
GFR calc Af Amer: >60 mL/min (ref >60)
GFR calc non Af Amer: 60 mL/min — ABNORMAL LOW (ref >60)
Glucose, Bld: 140 mg/dL — ABNORMAL HIGH (ref 65–99)
Potassium: 3.4 mmol/L — ABNORMAL LOW (ref 3.5–5.1)
Sodium: 137 mmol/L (ref 135–145)

## 2016-02-09 LAB — CBC
HCT: 41.6 % (ref 36.0–46.0)
Hemoglobin: 13.6 g/dL (ref 12.0–15.0)
MCH: 29.4 pg (ref 26.0–34.0)
MCHC: 32.7 g/dL (ref 30.0–36.0)
MCV: 90 fL (ref 78.0–100.0)
PLATELETS: 256 10*3/uL (ref 150–400)
RBC: 4.62 MIL/uL (ref 3.87–5.11)
RDW: 13.6 % (ref 11.5–15.5)
WBC: 8.8 10*3/uL (ref 4.0–10.5)

## 2016-02-09 LAB — URINALYSIS, ROUTINE W REFLEX MICROSCOPIC
Bilirubin Urine: NEGATIVE
GLUCOSE, UA: NEGATIVE mg/dL
HGB URINE DIPSTICK: NEGATIVE
Ketones, ur: NEGATIVE mg/dL
Leukocytes, UA: NEGATIVE
Nitrite: NEGATIVE
Protein, ur: NEGATIVE mg/dL
SPECIFIC GRAVITY, URINE: 1.038 — AB (ref 1.005–1.030)
pH: 5.5 (ref 5.0–8.0)

## 2016-02-09 LAB — BRAIN NATRIURETIC PEPTIDE: B Natriuretic Peptide: 66.9 pg/mL (ref 0.0–100.0)

## 2016-02-09 LAB — D-DIMER, QUANTITATIVE: D-Dimer, Quant: 2.6 ug/mL-FEU — ABNORMAL HIGH (ref 0.00–0.50)

## 2016-02-09 LAB — TROPONIN I: Troponin I: 0.03 ng/mL (ref <0.03)

## 2016-02-09 MED ORDER — NICOTINE 21 MG/24HR TD PT24
21.0000 mg | MEDICATED_PATCH | Freq: Every day | TRANSDERMAL | Status: DC
  Administered 2016-02-10 – 2016-02-12 (×4): 21 mg via TRANSDERMAL
  Filled 2016-02-09 (×4): qty 1

## 2016-02-09 MED ORDER — ACETAMINOPHEN 325 MG PO TABS
650.0000 mg | ORAL_TABLET | Freq: Four times a day (QID) | ORAL | Status: DC | PRN
Start: 2016-02-09 — End: 2016-02-12

## 2016-02-09 MED ORDER — IOPAMIDOL (ISOVUE-370) INJECTION 76%
INTRAVENOUS | Status: AC
  Filled 2016-02-09: qty 100

## 2016-02-09 MED ORDER — SODIUM CHLORIDE 0.9 % IV BOLUS (SEPSIS)
1000.0000 mL | Freq: Once | INTRAVENOUS | Status: AC
  Administered 2016-02-09: 1000 mL via INTRAVENOUS

## 2016-02-09 MED ORDER — HYDROCODONE-ACETAMINOPHEN 5-325 MG PO TABS: 1.0000 | ORAL_TABLET | ORAL | Status: DC | PRN

## 2016-02-09 MED ORDER — ONDANSETRON HCL 4 MG/2ML IJ SOLN: 4.0000 mg | Freq: Four times a day (QID) | INTRAMUSCULAR | Status: DC | PRN

## 2016-02-09 MED ORDER — ACETAMINOPHEN 650 MG RE SUPP: 650.0000 mg | Freq: Four times a day (QID) | RECTAL | Status: DC | PRN

## 2016-02-09 MED ORDER — SODIUM CHLORIDE 0.9 % IV SOLN
INTRAVENOUS | Status: DC
  Administered 2016-02-09: 22:00:00 via INTRAVENOUS

## 2016-02-09 MED ORDER — HEPARIN BOLUS VIA INFUSION
5000.0000 [IU] | Freq: Once | INTRAVENOUS | Status: AC
  Administered 2016-02-09: 5000 [IU] via INTRAVENOUS
  Filled 2016-02-09: qty 5000

## 2016-02-09 MED ORDER — HEPARIN (PORCINE) IN NACL 100-0.45 UNIT/ML-% IJ SOLN
1400.0000 [IU]/h | INTRAMUSCULAR | Status: DC
  Administered 2016-02-09 – 2016-02-11 (×3): 1400 [IU]/h via INTRAVENOUS
  Filled 2016-02-09 (×3): qty 250

## 2016-02-09 MED ORDER — POTASSIUM CHLORIDE CRYS ER 20 MEQ PO TBCR
40.0000 meq | EXTENDED_RELEASE_TABLET | Freq: Once | ORAL | Status: AC
  Administered 2016-02-10: 40 meq via ORAL
  Filled 2016-02-09: qty 2

## 2016-02-09 MED ORDER — SODIUM CHLORIDE 0.9 % IV SOLN
INTRAVENOUS | Status: DC
  Administered 2016-02-09 – 2016-02-10 (×2): via INTRAVENOUS

## 2016-02-09 MED ORDER — SODIUM CHLORIDE 0.9% FLUSH
3.0000 mL | Freq: Two times a day (BID) | INTRAVENOUS | Status: DC
Start: 2016-02-10 — End: 2016-02-12
  Administered 2016-02-11 (×2): 3 mL via INTRAVENOUS

## 2016-02-09 MED ORDER — IOPAMIDOL (ISOVUE-370) INJECTION 76%
INTRAVENOUS | Status: AC
  Administered 2016-02-09: 100 mL
  Filled 2016-02-09: qty 100

## 2016-02-09 MED ORDER — ONDANSETRON HCL 4 MG PO TABS: 4.0000 mg | ORAL_TABLET | Freq: Four times a day (QID) | ORAL | Status: DC | PRN

## 2016-02-09 NOTE — ED Notes (Signed)
Pt up to bathroom with assistance 

## 2016-02-09 NOTE — Consult Note (Signed)
PULMONARY / CRITICAL CARE MEDICINE   Name: Patricia [name] MRN: [id number] DOB: 1965/09/18    ADMISSION DATE:  02/09/2016 CONSULTATION DATE:  02/09/16  REFERRING MD:  Vanita Panda - EDP  CHIEF COMPLAINT:  LOC  HISTORY OF PRESENT ILLNESS:   Patricia [name] is a 50 y.o. F with PMH of HTN.  She has recently been taking Chantix as well for tobacco abuse. She presented to [facility] ED 8/30 after syncopal event.  She was apparently in her usual state of health up until a few months prior when she began to develop occasional headaches.  She saw a physician and was apparently only started on HCTZ for HTN along with toradol and phenergan per family medicine notes. Since restarting on HCTZ about 2 weeks PTA she was having some intermittent L leg cramps, which she attributed to potassium and could eat some bananas with improvement.   On 8/30, she did not have any headaches but suddenly began to feel lightheaded, warm, and had left sided chest discomfort.  She then had a brief syncopal episode.  Upon waking, she had no chest pain, SOB, headache, weakness.  In ED, she only had mid sternal burning sensation.  No SOB, lightheadedness.  She had CTA which revealed bilateral PE with mild RHS.  PCCM was consulted for consideration EKOS.  Denied any hx prior VTE, malignancy, hemoptysis, recent prolonged periods of immobilization, recent surgery/travel. She is not on any hormone therapy. No family history of autoimmune disease, but there is a stron history of blood clots. She currently voices no complaints.  She does have obesity and is a current smoker.  She had hysterectomy in 2010 for benign disease.    PAST MEDICAL HISTORY :  Past Medical History:  Diagnosis Date  . Hypertension     PAST SURGICAL HISTORY: Past Surgical History:  Procedure Laterality Date  . ABDOMINAL HYSTERECTOMY    . BREAST REDUCTION SURGERY      No Known Allergies  No current facility-administered medications on file prior to  encounter.    Current Outpatient Prescriptions on File Prior to Encounter  Medication Sig  . hydrochlorothiazide (HYDRODIURIL) 25 MG tablet Take 1 tablet (25 mg total) by mouth daily.  Marland Kitchen ibuprofen (ADVIL,MOTRIN) 200 MG tablet Take 200 mg by mouth every 6 (six) hours as needed for headache.   . SUMAtriptan (IMITREX) 25 MG tablet Please take one tablet at the onset of a migraine. May repeat once in 2 hours if headache persists or recurs.  Marland Kitchen buPROPion (WELLBUTRIN SR) 150 MG 12 hr tablet Take 1 tablet (150 mg total) by mouth 2 (two) times daily. (Patient not taking: Reported on 02/09/2016)  . CHANTIX CONTINUING MONTH PAK 1 MG tablet TAKE 1 TABLET BY MOUTH TWICE DAILY    FAMILY HISTORY:  Family History  Problem Relation Age of Onset  . Deep vein thrombosis Other   . Clotting disorder Mother   . Clotting disorder Sister   . Clotting disorder Brother   . Clotting disorder Maternal Uncle     SOCIAL HISTORY: Social History  Substance Use Topics  . Smoking status: Current Some Day Smoker    Packs/day: 0.30    Types: Cigarettes  . Smokeless tobacco: Never Used     Comment: using electronic cigarettes too  . Alcohol use Yes    REVIEW OF SYSTEMS:    Bolds are positive  Constitutional: weight loss, gain, night sweats, Fevers, chills, fatigue .  HEENT: headaches, Sore throat, sneezing, nasal congestion, post nasal drip,  Difficulty swallowing, Tooth/dental problems, visual complaints visual changes, ear ache CV:  chest pain, radiates: ,Orthopnea, PND, swelling in lower extremities, dizziness, palpitations, syncope.  GI  heartburn, indigestion, abdominal pain, nausea, vomiting, diarrhea, change in bowel habits, loss of appetite, bloody stools.  Resp: cough, productive: , hemoptysis, dyspnea, chest pain, pleuritic.  Skin: rash or itching or icterus GU: dysuria, change in color of urine, urgency or frequency. flank pain, hematuria  MS: L leg cramps or swelling. decreased range of motion  Psych:  change in mood or affect. depression or anxiety.  Neuro: difficulty with speech, weakness, numbness, ataxia   VITAL SIGNS: BP 154/96 (BP Location: Right Arm)   Pulse 108   Resp 18   Ht 5\' 7"  (1.702 m)   Wt 247 lb (112 kg)   SpO2 99%   BMI 38.69 kg/m   HEMODYNAMICS:    VENTILATOR SETTINGS:    INTAKE / OUTPUT: No intake/output data recorded.  PHYSICAL EXAMINATION: General:  MO female in NAD on RA Neuro:  Alert, oriented, non-focal HEENT:  [place]/AT, PERRL, no JVD Cardiovascular:  Tachy, regular, no MRG Lungs:  Clear Abdomen:  Soft, non-tender, non-distended Musculoskeletal:  No acute deformity or ROM limitation Skin:  Grossly intact  LABS:  BMET  Recent Labs Lab 02/09/16 1628  NA 137  K 3.4*  CL 105  CO2 23  BUN 15  CREATININE 1.07*  GLUCOSE 140*    Electrolytes  Recent Labs Lab 02/09/16 1628  CALCIUM 9.4    CBC  Recent Labs Lab 02/09/16 1628  WBC 8.8  HGB 13.6  HCT 41.6  PLT 256    Coag's No results for input(s): APTT, INR in the last 168 hours.  Sepsis Markers No results for input(s): LATICACIDVEN, PROCALCITON, O2SATVEN in the last 168 hours.  ABG No results for input(s): PHART, PCO2ART, PO2ART in the last 168 hours.  Liver Enzymes No results for input(s): AST, ALT, ALKPHOS, BILITOT, ALBUMIN in the last 168 hours.  Cardiac Enzymes  Recent Labs Lab 02/09/16 1628  TROPONINI 0.03*    Glucose No results for input(s): GLUCAP in the last 168 hours.  Imaging Ct Angio Chest Pe W/cm &/or Wo Cm  Result Date: 02/09/2016 CLINICAL DATA:  Syncope EXAM: CT ANGIOGRAPHY CHEST WITH CONTRAST TECHNIQUE: Multidetector CT imaging of the chest was performed using the standard protocol during bolus administration of intravenous contrast. Multiplanar CT image reconstructions and MIPs were obtained to evaluate the vascular anatomy. CONTRAST:  100 cc Isovue 370 COMPARISON:  None. FINDINGS: There is acute bilateral pulmonary thromboembolism. Irregular  filling defect is present in the peripheral right pulmonary artery extending into lobar and segmental branches. There is also a small amount of thrombus burden in the peripheral left pulmonary artery extending into left upper and lower lobe lobar and segmental branches. Right ventricle the left ventricle ratio is 1.3 consistent with mild right heart strain. No evidence of aortic dissection. No abnormal mediastinal adenopathy. No pneumothorax.  No pleural effusion Scattered minimal subsegmental atelectasis in the lungs. No mass or consolidation. No acute bony deformity. Postcholecystectomy. Review of the MIP images confirms the above findings. IMPRESSION: The study is positive for bilateral acute pulmonary thromboembolism, associated with mild right heart strain. Critical Value/emergent results were called by telephone at the time of interpretation on 02/09/2016 at 6:38 pm to Dr. Carmin Muskrat , who verbally acknowledged these results. Electronically Signed   By: Marybelle Killings M.D.   On: 02/09/2016 18:38     STUDIES:  CTA 8/30 > She  had CTA which revealed bilateral PE with mild RHS.  CULTURES: None.  ANTIBIOTICS: None.  SIGNIFICANT EVENTS: 8/30 > admitted with PE.  PCCM called for consideration EKOS.  LINES/TUBES: PIV x1  DISCUSSION: 50 y.o. F admitted 8/30 with bilateral PE with mild right heart strain.  PCCM called for consideration EKOS. Despite having a syncopal episode today, she is tolerating room air just fine without chest pain or SOB. She is HD stable, in fact   ASSESSMENT / PLAN:  Acute bilateral PE - CT suggesting mild RHS.  Pt comfortable on exam, not requiring supplemental O2, vitals stable -Start heparin gtt. -Transition to oral agent, will likely need 12 months + given unprovoked VTE. -No role systemic or catheter directed lysis at this point. Can re-evaluate after echo -Assess echo, LE duplex. -Trend troponins. -Supplemental O2 as needed. -Tobacco cessation  counseling.  Sinus tach - due to PE. Mild troponin bump - likely due to PE. Hx HTN -Trend troponins. -Assess echo. -Anticoagulation as above.  Mild hypokalemia. AKI - at risk for worsening following contrast administration.   -67mEq K PO x 1. -NS @ 75.  -BMP in AM.  Stable to admission to telemetry unit under hospitalist care.  Georgann Housekeeper, AGACNP-BC [facility] Pulmonology/Critical Care Pager [phone] or [phone]  02/09/2016 8:52 PM  PCCM Attending Note: Patient seen and examined with nurse practitioner. Please refer to his consultation note which I have reviewed. Patient had syncopal event after using restroom and washing her hands today. Reports some mild left anterior chest discomfort but no other chest pain or tightness. Reports dyspnea on exertion only but none at rest. Patient has noted over the last week to have some "cramping" pain in her left lower extremity which she had attributed to hypokalemia having recently restarted hydrochlorothiazide. Denies any prior history of clots post surgery or otherwise. Denies any recent immobilization or long trips. Denies any hormone replacement therapy.  BP (!) 137/109   Pulse 108   Resp (!) 28   Ht 5\' 7"  (1.702 m)   Wt 247 lb (112 kg)   SpO2 98%   BMI 38.69 kg/m  General:  Obese female. No family at bedside. Awake. Alert.  Integument:  Warm & dry. No rash on exposed skin.  HEENT:  Moist mucus membranes. No oral ulcers. No scleral injection or icterus.  Cardiovascular:  Regular rhythm and slightly tachycardic. No edema. No appreciable JVD.  Pulmonary:  Good aeration & clear to auscultation bilaterally. Normal work of breathing on room air. Abdomen: Soft. Normal bowel sounds.  Protuberant. Grossly nontender. Musculoskeletal:  Normal bulk and tone. No joint deformity or effusion appreciated.  A/P:  50 y.o. female with acute bilateral pulmonary emboli. Suggestion of right heart strain on CT angiogram but patient is currently  hemodynamically stable. Certainly her syncopal event is of concern and warrants further workup for possible right ventricular dysfunction in the setting of her bilateral pulmonary emboli. However, with her hypertension and normal saturation on room air I do not feel that thrombolytic therapy is necessary at this time. I did discuss the risks of continuing systemic anticoagulation versus lytic therapy with the patient including the increased risk of bleeding with lytic therapy. We also discussed her multiple family members who have clots without evidence of provocation in her mother or maternal uncle. This certainly suggest a possible underlying hypercoagulable state; although, without prior history of DVT/PE during her previous surgeries I feel that evaluation by specialist would be reasonable.  1. Bilateral pulmonary emboli: Recommend  continuing workup with bilateral lower extremity venous duplex and transthoracic echocardiogram. Recommend continuing systemic anticoagulation. The patient shows signs of further decline in terms of oxygen requirement at rest, hypotension, syncope, or worsening tachycardia I would then recommend reconsideration of catheter directed thrombolytic therapy. 2. Possible hypercoagulable state: Recommend evaluation by hematology and consideration of lab work for prothrombin gene mutation and factor V Leiden. Obviously we'll defer this to hematology's evaluation. 3. Tobacco use disorder: Recommend tobacco cessation education prior to discharge from hospital.  Plan for admission to stepdown unit and hospitalist service. We will be available as needed.  Sonia Baller Ashok Cordia, M.D. [facility] Pager:  [phone] After 3pm or if no response, call [phone] 9:54 PM 02/09/16

## 2016-02-09 NOTE — ED Provider Notes (Signed)
[place] DEPT Provider Note   CSN: [id number] Arrival date & time: 02/09/16  1614     History   Chief Complaint Chief Complaint  Patient presents with  . Loss of Consciousness    HPI Patricia [name] is a 50 y.o. female.  HPI  He presents after an episode of syncope. Patient notes that until a few months ago she was in her usual state of health, without ongoing medical issues. About that time she started developing occasional headaches. After seeing a physician she was started on hydrochlorothiazide, and this is the only medication she takes. Today, without headache, patient began to feel lightheaded, warm, with chest discomfort, sternal, left-sided. She had a brief episode of loss of consciousness. Upon awakening, no additional chest pain, no headache, no weakness anywhere. Currently, the patient has no headache still, no weakness in any extremity, no numbness in any extremity. She does have mild burning sensation in her sternum and left chest. No medication taken for symptom relief. Patient denies history of coronary disease. She acknowledges family members with history of pulmonary embolism.   Past Medical History:  Diagnosis Date  . Hypertension     Patient Active Problem List   Diagnosis Date Noted  . Obesity 04/01/2012  . Tobacco abuse 04/01/2012  . HEADACHE, TENSION 06/17/2010  . ANKLE EDEMA 06/17/2010  . OTITIS MEDIA, ACUTE 03/18/2010  . ACUTE BRONCHITIS 03/18/2010  . VOMITING 03/18/2010  . URI 03/15/2009  . ADVERSE DRUG REACTION 08/18/2008  . PLANTAR FASCIITIS, RIGHT 08/14/2008  . GASTROENTERITIS 03/20/2008    Past Surgical History:  Procedure Laterality Date  . ABDOMINAL HYSTERECTOMY    . BREAST REDUCTION SURGERY      OB History    No data available       Home Medications    Prior to Admission medications   Medication Sig Start Date End Date Taking? Authorizing Provider  hydrochlorothiazide (HYDRODIURIL) 25 MG tablet Take 1  tablet (25 mg total) by mouth daily. 01/20/16  Yes Lucretia Kern, DO  ibuprofen (ADVIL,MOTRIN) 200 MG tablet Take 200 mg by mouth every 6 (six) hours as needed for headache.    Yes Historical Provider, MD  ondansetron (ZOFRAN-ODT) 4 MG disintegrating tablet Take 4 mg by mouth every 4 (four) hours as needed for nausea/vomiting. 12/16/15  Yes Historical Provider, MD  SUMAtriptan (IMITREX) 25 MG tablet Please take one tablet at the onset of a migraine. May repeat once in 2 hours if headache persists or recurs. 01/20/16  Yes Lucretia Kern, DO  buPROPion (WELLBUTRIN SR) 150 MG 12 hr tablet Take 1 tablet (150 mg total) by mouth 2 (two) times daily. Patient not taking: Reported on 02/09/2016 01/11/15   Marletta Lor, MD  [place] PAK 1 MG tablet TAKE 1 TABLET BY MOUTH TWICE DAILY 11/23/15   Marletta Lor, MD    Family History No family history on file.  Social History Social History  Substance Use Topics  . Smoking status: Current Some Day Smoker    Packs/day: 0.30    Types: Cigarettes  . Smokeless tobacco: Never Used     Comment: using electronic cigarettes too  . Alcohol use Yes     Allergies   Review of patient's allergies indicates no known allergies.   Review of Systems Review of Systems  Constitutional:       Per HPI, otherwise negative  HENT:       Per HPI, otherwise negative  Respiratory:  Per HPI, otherwise negative  Cardiovascular:       Per HPI, otherwise negative  Gastrointestinal: Negative for vomiting.  Endocrine:       Negative aside from HPI  Genitourinary:       Neg aside from HPI   Musculoskeletal:       Per HPI, otherwise negative  Skin: Negative.   Neurological: Positive for syncope and headaches.     Physical Exam Updated Vital Signs BP 142/94   Pulse 118   Resp 22   Ht 5\' 7"  (1.702 m)   Wt 247 lb (112 kg)   SpO2 98%   BMI 38.69 kg/m   Physical Exam  Constitutional: She is oriented to person, place, and time. She  appears well-developed and well-nourished. No distress.  HENT:  Head: Normocephalic and atraumatic.  Eyes: Conjunctivae and EOM are normal.  Cardiovascular: Regular rhythm.  Tachycardia present.   Pulmonary/Chest: Effort normal and breath sounds normal. No stridor. No respiratory distress.  Abdominal: She exhibits no distension.  Musculoskeletal: She exhibits no edema.  No appreciable swelling or deformities anywhere  Neurological: She is alert and oriented to person, place, and time. No cranial nerve deficit.  Skin: Skin is warm and dry.  Psychiatric: She has a normal mood and affect.  Nursing note and vitals reviewed.    ED Treatments / Results  Labs (all labs ordered are listed, but only abnormal results are displayed) Labs Reviewed  BASIC METABOLIC PANEL - Abnormal; Notable for the following:       Result Value   Potassium 3.4 (*)    Glucose, Bld 140 (*)    Creatinine, Ser 1.07 (*)    GFR calc non Af Amer 60 (*)    All other components within normal limits  TROPONIN I - Abnormal; Notable for the following:    Troponin I 0.03 (*)    All other components within normal limits  CBC  BRAIN NATRIURETIC PEPTIDE  D-DIMER, QUANTITATIVE (NOT AT [facility])    EKG  EKG Interpretation  Date/Time:  Wednesday February 09 2016 16:24:31 EDT Ventricular Rate:  119 PR Interval:    QRS Duration: 85 QT Interval:  332 QTC Calculation: 468 R Axis:   17 Text Interpretation:  Sinus tachycardia Borderline T abnormalities, anterior leads Abnormal ekg Confirmed by Carmin Muskrat  MD [phone]) on 02/09/2016 4:29:02 PM        Cardiac: 120's, abnormal After the initial evaluation the patient states that she has multiple family members with history of blood clot.    Radiology Ct Angio Chest Pe W/cm &/or Wo Cm  Result Date: 02/09/2016 CLINICAL DATA:  Syncope EXAM: CT ANGIOGRAPHY CHEST WITH CONTRAST TECHNIQUE: Multidetector CT imaging of the chest was performed using the standard protocol during  bolus administration of intravenous contrast. Multiplanar CT image reconstructions and MIPs were obtained to evaluate the vascular anatomy. CONTRAST:  100 cc Isovue 370 COMPARISON:  None. FINDINGS: There is acute bilateral pulmonary thromboembolism. Irregular filling defect is present in the peripheral right pulmonary artery extending into lobar and segmental branches. There is also a small amount of thrombus burden in the peripheral left pulmonary artery extending into left upper and lower lobe lobar and segmental branches. Right ventricle the left ventricle ratio is 1.3 consistent with mild right heart strain. No evidence of aortic dissection. No abnormal mediastinal adenopathy. No pneumothorax.  No pleural effusion Scattered minimal subsegmental atelectasis in the lungs. No mass or consolidation. No acute bony deformity. Postcholecystectomy. Review of the MIP images confirms  the above findings. IMPRESSION: The study is positive for bilateral acute pulmonary thromboembolism, associated with mild right heart strain. Critical Value/emergent results were called by telephone at the time of interpretation on 02/09/2016 at 6:38 pm to Dr. Carmin Muskrat , who verbally acknowledged these results. Electronically Signed   By: Marybelle Killings M.D.   On: 02/09/2016 18:38  I discussed the patient's CT study with our radiologist. On repeat exam the patient remains tachycardic, uncomfortable appearing. I discussed CT findings with our pulmonary critical care team, patient started on heparin.   Procedures Procedures (including critical care time) Patient presents after an episode of syncope. Patient does have chest pain, and with exertion has notable dyspnea. Patient has no initial description of risk factors for PE, but eventually describes multiple family members with history of thromboembolic disease. Patient's initial evaluation included positive troponin, d-dimer, and subsequent CT scan was consistent with acute  bilateral pulmonary embolism, with right heart strain. Patient was started on heparin, and I discussed her case with critical care, prior to admission for further evaluation, management.  CRITICAL CARE Performed by: Carmin Muskrat Total critical care time: 35 minutes Critical care time was exclusive of separately billable procedures and treating other patients. Critical care was necessary to treat or prevent imminent or life-threatening deterioration. Critical care was time spent personally by me on the following activities: development of treatment plan with patient and/or surrogate as well as nursing, discussions with consultants, evaluation of patient's response to treatment, examination of patient, obtaining history from patient or surrogate, ordering and performing treatments and interventions, ordering and review of laboratory studies, ordering and review of radiographic studies, pulse oximetry and re-evaluation of patient's condition.      Carmin Muskrat, MD 02/09/16 [phone]

## 2016-02-09 NOTE — ED Triage Notes (Signed)
Pt presents via [facility] for evaluation of syncopal event today while sitting. Pt. States she felt dizzy and hot prior to event and sat down. Event was witnessed by coworker, no sz activity noted. Pt. States dizziness only today. CBG 176. HTN on arrival, hx of same, took BP meds this AM. Pt. States recently HCTZ approx 2 weeks ago. AxO x4.

## 2016-02-09 NOTE — ED Notes (Signed)
MD at bedside. Hospitalist

## 2016-02-09 NOTE — ED Notes (Signed)
Pt. Transported to CT 

## 2016-02-09 NOTE — H&P (Signed)
Patricia [name] [id number] DOB: 11-Oct-1965 DOA: 02/09/2016     PCP: Nyoka Cowden, MD   Outpatient Specialists: none Patient coming from:   home Lives With family    Chief Complaint: Syncope  HPI: Patricia [name] is a 50 y.o. female with medical history significant of tobacco abuse, obesity BMI 38 and HTN recent diagnosis of migraine headaches    Presented with syncopal or presents today while sitting down she felt dizzy event was witnessed by coworkers no seizure activity was noted CBG was 176 she was normotensive. Patient recently has been evaluated for migraine headaches but they have resolved and today she was feeling well. This syncope was associated and mild chest discomfort substernal and to the left when she regained consciousness there was no more chest pain she was not postictal no neurological complaints and does endorse an occasional burning sensation substernal  Is no personal history of pulmonary embolism  no family history of coronary artery disease. Asian have had some leg cramps but she 40 was secondary to low potassium. Patient is sp hysterectomy No personal history of DVT , cancer, travel or known hormone therapy, no leg swelling she has strong Family hx of Blood clots on mother's side She recently was started on Imitrex for presumed migraine headache  While in ER she became severely dyspneic with ambulation   IN ER:       BP 142/94 pulse 118 respirations 22 oxygen saturation % 98 on room air at rest Patient became dyspneic and tachypnea on ambulation BMI 38 Reacting 1.07 hemoglobin 13.6 platelets 256 troponin 0.03  Following Medications were ordered in ER: Medications  heparin ADULT infusion 100 units/mL (25000 units/244mL sodium chloride 0.45%) (1,400 Units/hr Intravenous New Bag/Given 02/09/16 1914)  0.9 %  sodium chloride infusion (not administered)  sodium chloride 0.9 % bolus 1,000 mL (1,000 mLs Intravenous New Bag/Given 02/09/16 1718)    iopamidol (ISOVUE-370) 76 % injection (100 mLs  Contrast Given 02/09/16 1816)  heparin bolus via infusion 5,000 Units (5,000 Units Intravenous Bolus from Bag 02/09/16 1920)   CT chest showed: A lateral acute PE mild right heart strain  Heparin was started  ER provider discussed case with:  [facility] examine the patient's feels that at this point patient is stable for admission to [facility] step down  Hospitalist was called for admission for bilateral pulmonary embolism with evidence of right heart strain on CT  Review of Systems:    Pertinent positives include:  indigestion, chest pain, headaches, syncope  Constitutional:  No weight loss, night sweats, Fevers, chills, fatigue, weight loss  HEENT:  No Difficulty swallowing,Tooth/dental problems,Sore throat,  No sneezing, itching, ear ache, nasal congestion, post nasal drip,  Cardio-vascular:  No Orthopnea, PND, anasarca, dizziness, palpitations.no Bilateral lower extremity swelling  GI:  No heartburn, abdominal pain, nausea, vomiting, diarrhea, change in bowel habits, loss of appetite, melena, blood in stool, hematemesis Resp:  no shortness of breath at rest. No dyspnea on exertion, No excess mucus, no productive cough, No non-productive cough, No coughing up of blood.No change in color of mucus.No wheezing. Skin:  no rash or lesions. No jaundice GU:  no dysuria, change in color of urine, no urgency or frequency. No straining to urinate.  No flank pain.  Musculoskeletal:  No joint pain or no joint swelling. No decreased range of motion. No back pain.  Psych:  No change in mood or affect. No depression or anxiety. No memory loss.  Neuro: no localizing neurological  complaints, no tingling, no weakness, no double vision, no gait abnormality, no slurred speech, no confusion  As per HPI otherwise 10 point review of systems negative.   Past Medical History: Past Medical History:  Diagnosis Date  . Hypertension    Past  Surgical History:  Procedure Laterality Date  . ABDOMINAL HYSTERECTOMY    . BREAST REDUCTION SURGERY       Social History:  Ambulatory   independently      reports that she has been smoking Cigarettes.  She has been smoking about 0.30 packs per day. She has never used smokeless tobacco. She reports that she drinks alcohol. She reports that she does not use drugs.  Allergies:  No Known Allergies     Family History:   Family History  Problem Relation Age of Onset  . Deep vein thrombosis Other   . Clotting disorder Mother   . Clotting disorder Sister   . Clotting disorder Brother   . Clotting disorder Maternal Uncle     Medications: Prior to Admission medications   Medication Sig Start Date End Date Taking? Authorizing Provider  hydrochlorothiazide (HYDRODIURIL) 25 MG tablet Take 1 tablet (25 mg total) by mouth daily. 01/20/16  Yes Lucretia Kern, DO  ibuprofen (ADVIL,MOTRIN) 200 MG tablet Take 200 mg by mouth every 6 (six) hours as needed for headache.    Yes Historical Provider, MD  ondansetron (ZOFRAN-ODT) 4 MG disintegrating tablet Take 4 mg by mouth every 4 (four) hours as needed for nausea/vomiting. 12/16/15  Yes Historical Provider, MD  SUMAtriptan (IMITREX) 25 MG tablet Please take one tablet at the onset of a migraine. May repeat once in 2 hours if headache persists or recurs. 01/20/16  Yes Lucretia Kern, DO  buPROPion (WELLBUTRIN SR) 150 MG 12 hr tablet Take 1 tablet (150 mg total) by mouth 2 (two) times daily. Patient not taking: Reported on 02/09/2016 01/11/15   Marletta Lor, MD  CHANTIX CONTINUING MONTH PAK 1 MG tablet TAKE 1 TABLET BY MOUTH TWICE DAILY 11/23/15   Marletta Lor, MD    Physical Exam: Patient Vitals for the past 24 hrs:  BP Pulse Resp SpO2 Height Weight  02/09/16 1913 154/96 108 18 99 % - -  02/09/16 1911 - - - 100 % - -  02/09/16 1845 (!) 152/108 110 20 99 % - -  02/09/16 1745 136/81 115 (!) 33 99 % - -  02/09/16 1630 142/94 118 22 98 % - -   02/09/16 1616 - - - - 5\' 7"  (1.702 m) 112 kg (247 lb)  02/09/16 1614 - - - 97 % - -    1. General:  in No Acute distress 2. Psychological: Alert and   Oriented 3. Head/ENT:   Moist  Mucous Membranes                          Head Non traumatic, neck supple                          Normal   Dentition 4. SKIN:   decreased Skin turgor,  Skin clean Dry and intact no rash 5. Heart: Regular rate and rhythm no Murmur, Rub or gallop 6. Lungs: Clear to auscultation bilaterally, no wheezes or crackles   7. Abdomen: Soft,  non-tender, Non distend obese 8. Lower extremities: no clubbing, cyanosis, or edema 9. Neurologically Grossly intact, moving all 4 extremities equally  10. MSK: Normal range of motion   body mass index is 38.69 kg/m.  Labs on Admission:   Labs on Admission: I have personally reviewed following labs and imaging studies  CBC:  Recent Labs Lab 02/09/16 1628  WBC 8.8  HGB 13.6  HCT 41.6  MCV 90.0  PLT 123456   Basic Metabolic Panel:  Recent Labs Lab 02/09/16 1628  NA 137  K 3.4*  CL 105  CO2 23  GLUCOSE 140*  BUN 15  CREATININE 1.07*  CALCIUM 9.4   GFR: Estimated Creatinine Clearance: 82.1 mL/min (by C-G formula based on SCr of 1.07 mg/dL). Liver Function Tests: No results for input(s): AST, ALT, ALKPHOS, BILITOT, PROT, ALBUMIN in the last 168 hours. No results for input(s): LIPASE, AMYLASE in the last 168 hours. No results for input(s): AMMONIA in the last 168 hours. Coagulation Profile: No results for input(s): INR, PROTIME in the last 168 hours. Cardiac Enzymes:  Recent Labs Lab 02/09/16 1628  TROPONINI 0.03*   BNP (last 3 results) No results for input(s): PROBNP in the last 8760 hours. HbA1C: No results for input(s): HGBA1C in the last 72 hours. CBG: No results for input(s): GLUCAP in the last 168 hours. Lipid Profile: No results for input(s): CHOL, HDL, LDLCALC, TRIG, CHOLHDL, LDLDIRECT in the last 72 hours. Thyroid Function Tests: No  results for input(s): TSH, T4TOTAL, FREET4, T3FREE, THYROIDAB in the last 72 hours. Anemia Panel: No results for input(s): VITAMINB12, FOLATE, FERRITIN, TIBC, IRON, RETICCTPCT in the last 72 hours. Urine analysis:    Component Value Date/Time   COLORURINE YELLOW 01/17/2009 1516   APPEARANCEUR CLEAR 01/17/2009 1516   LABSPEC >1.030 (H) 01/17/2009 1516   PHURINE 5.5 01/17/2009 1516   GLUCOSEU NEGATIVE 01/17/2009 1516   HGBUR LARGE (A) 01/17/2009 1516   BILIRUBINUR neg 01/01/2015 1107   KETONESUR NEGATIVE 01/17/2009 1516   PROTEINUR neg 01/01/2015 1107   PROTEINUR NEGATIVE 01/17/2009 1516   UROBILINOGEN 0.2 01/01/2015 1107   UROBILINOGEN 0.2 01/17/2009 1516   NITRITE pos 01/01/2015 1107   NITRITE NEGATIVE 01/17/2009 1516   LEUKOCYTESUR Negative 01/01/2015 1107   Sepsis Labs: @LABRCNTIP (procalcitonin:4,lacticidven:4) )No results found for this or any previous visit (from the past 240 hour(s)).     UA   ordered  No results found for: HGBA1C  Estimated Creatinine Clearance: 82.1 mL/min (by C-G formula based on SCr of 1.07 mg/dL).  BNP (last 3 results) No results for input(s): PROBNP in the last 8760 hours.   ECG REPORT  Independently reviewed Rate: 119  Rhythm: Sinus tachycardia ST&T Change: No acute ischemic changes   QTC 468  Filed Weights   02/09/16 1616  Weight: 112 kg (247 lb)    Cultures: No results found for: SDES, SPECREQUEST, CULT, REPTSTATUS   Radiological Exams on Admission: Ct Angio Chest Pe W/cm &/or Wo Cm  Result Date: 02/09/2016 CLINICAL DATA:  Syncope EXAM: CT ANGIOGRAPHY CHEST WITH CONTRAST TECHNIQUE: Multidetector CT imaging of the chest was performed using the standard protocol during bolus administration of intravenous contrast. Multiplanar CT image reconstructions and MIPs were obtained to evaluate the vascular anatomy. CONTRAST:  100 cc Isovue 370 COMPARISON:  None. FINDINGS: There is acute bilateral pulmonary thromboembolism. Irregular filling  defect is present in the peripheral right pulmonary artery extending into lobar and segmental branches. There is also a small amount of thrombus burden in the peripheral left pulmonary artery extending into left upper and lower lobe lobar and segmental branches. Right ventricle the left ventricle ratio is 1.3 consistent  with mild right heart strain. No evidence of aortic dissection. No abnormal mediastinal adenopathy. No pneumothorax.  No pleural effusion Scattered minimal subsegmental atelectasis in the lungs. No mass or consolidation. No acute bony deformity. Postcholecystectomy. Review of the MIP images confirms the above findings. IMPRESSION: The study is positive for bilateral acute pulmonary thromboembolism, associated with mild right heart strain. Critical Value/emergent results were called by telephone at the time of interpretation on 02/09/2016 at 6:38 pm to Dr. Carmin Muskrat , who verbally acknowledged these results. Electronically Signed   By: Marybelle Killings M.D.   On: 02/09/2016 18:38    Chart has been reviewed    Assessment/Plan  50 y.o. female with medical history significant of tobacco abuse, obesity BMI 38 and HTN recent diagnosis of migraine headaches in admitted for bilateral pulmonary embolism with right heart strain  Present on Admission: . Pulmonary embolism ([place]) - given strong family hx may benefit from hypercoagulable work up as an out patietn and hematology follow up. Will obtain echo, monitor in stepdown, obtain Dopplers to eval for clot burden, on Heparin drip will transition to appropriate oral therapy . HEADACHE, TENSION - Suportive  management . Tobacco abuse - patient is ready to quit , nicotine patch and tobacco cessation protocol . Hypertension - hold HCTZ for tonight, permissive hypertension . Hypokalemia - will replace     Other plan as per orders.  DVT prophylaxis:  heparin     Code Status:  FULL CODE   as per patient    Family Communication:   Family  at   Bedside  plan of care was discussed with  Husband Lautius T. Antwi [phone],    Disposition Plan:      To home once workup is complete and patient is stable                                      consult care manager to long-term anticoagulation medication management                          Consults called: PCCM aware    Admission status:    inpatient      Level of care     SDU given large clot burden and patient being symptomatic with ambulation     I have spent a total of 56 min on this admission   extra time was spent to discuss case with PCCM  [place] 02/09/2016, 10:26 PM    [facility]  Pager [phone]   after 2 AM please page floor coverage PA If 7AM-7PM, please contact the day team taking care of the patient  [email or web address]  Password [email or web address]

## 2016-02-09 NOTE — ED Notes (Signed)
Rolled patient to the bathroom in a wheelchair. Tolerated well.

## 2016-02-09 NOTE — Progress Notes (Signed)
ANTICOAGULATION CONSULT NOTE - Initial Consult  Pharmacy Consult for heparin Indication: pulmonary embolus  No Known Allergies  Patient Measurements: Height: 5\' 7"  (170.2 cm) Weight: 247 lb (112 kg) IBW/kg (Calculated) : 61.6 Heparin Dosing Weight: 87.5 kg  Vital Signs: BP: 154/96 (08/30 1913) Pulse Rate: 108 (08/30 1913)  Labs:  Recent Labs  02/09/16 1628  HGB 13.6  HCT 41.6  PLT 256  CREATININE 1.07*  TROPONINI 0.03*    Estimated Creatinine Clearance: 82.1 mL/min (by C-G formula based on SCr of 1.07 mg/dL).   Medical History: Past Medical History:  Diagnosis Date  . Hypertension     Assessment: 50 yo female presenting with acute bilateral pulmonary embolism, with right heart strain. PMH includes tobacco abuse, obesity, HTN  SCr 1.07, Hgb 13.6, PLTC 256, troponin 0.03, no AC PTA, no s/sx bleeding.    Goal of Therapy:  Heparin level 0.3-0.7 units/ml Monitor platelets by anticoagulation protocol: Yes   Plan:  Give 5000 units bolus x 1 Start heparin infusion at 1400 units/hr Check anti-Xa level in 6 hours and daily while on heparin Continue to monitor H&H and platelets  Carlean Jews, Pharm.D. PGY1 Pharmacy Resident 8/30/20177:37 PM Pager [phone]

## 2016-02-10 ENCOUNTER — Inpatient Hospital Stay (HOSPITAL_COMMUNITY): Payer: BLUE CROSS/BLUE SHIELD

## 2016-02-10 ENCOUNTER — Other Ambulatory Visit: Payer: Self-pay

## 2016-02-10 DIAGNOSIS — I2699 Other pulmonary embolism without acute cor pulmonale: Secondary | ICD-10-CM

## 2016-02-10 LAB — COMPREHENSIVE METABOLIC PANEL
ALT: 64 U/L — ABNORMAL HIGH (ref 14–54)
ANION GAP: 4 — AB (ref 5–15)
AST: 46 U/L — ABNORMAL HIGH (ref 15–41)
Albumin: 3.4 g/dL — ABNORMAL LOW (ref 3.5–5.0)
Alkaline Phosphatase: 80 U/L (ref 38–126)
BUN: 14 mg/dL (ref 6–20)
CHLORIDE: 107 mmol/L (ref 101–111)
CO2: 29 mmol/L (ref 22–32)
Calcium: 9 mg/dL (ref 8.9–10.3)
Creatinine, Ser: 1.12 mg/dL — ABNORMAL HIGH (ref 0.44–1.00)
GFR calc non Af Amer: 57 mL/min — ABNORMAL LOW (ref >60)
Glucose, Bld: 113 mg/dL — ABNORMAL HIGH (ref 65–99)
Potassium: 4.2 mmol/L (ref 3.5–5.1)
SODIUM: 140 mmol/L (ref 135–145)
Total Bilirubin: 0.3 mg/dL (ref 0.3–1.2)
Total Protein: 6 g/dL — ABNORMAL LOW (ref 6.5–8.1)

## 2016-02-10 LAB — ECHOCARDIOGRAM COMPLETE
Height: 67 in
WEIGHTICAEL: 4328.07 [oz_av]

## 2016-02-10 LAB — CBC
HCT: 39.5 % (ref 36.0–46.0)
HEMOGLOBIN: 12.7 g/dL (ref 12.0–15.0)
MCH: 29.3 pg (ref 26.0–34.0)
MCHC: 32.2 g/dL (ref 30.0–36.0)
MCV: 91.2 fL (ref 78.0–100.0)
Platelets: 228 10*3/uL (ref 150–400)
RBC: 4.33 MIL/uL (ref 3.87–5.11)
RDW: 13.6 % (ref 11.5–15.5)
WBC: 9.1 10*3/uL (ref 4.0–10.5)

## 2016-02-10 LAB — MRSA PCR SCREENING: MRSA by PCR: NEGATIVE

## 2016-02-10 LAB — HEPARIN LEVEL (UNFRACTIONATED)
HEPARIN UNFRACTIONATED: 0.66 [IU]/mL (ref 0.30–0.70)
Heparin Unfractionated: 0.7 IU/mL (ref 0.30–0.70)

## 2016-02-10 LAB — TSH: TSH: 1.319 u[IU]/mL (ref 0.350–4.500)

## 2016-02-10 LAB — MAGNESIUM: MAGNESIUM: 1.9 mg/dL (ref 1.7–2.4)

## 2016-02-10 LAB — TROPONIN I: TROPONIN I: 0.03 ng/mL — AB (ref <0.03)

## 2016-02-10 LAB — PHOSPHORUS: Phosphorus: 4.4 mg/dL (ref 2.5–4.6)

## 2016-02-10 MED ORDER — PERFLUTREN LIPID MICROSPHERE
1.0000 mL | INTRAVENOUS | Status: AC | PRN
  Administered 2016-02-10: 2 mL via INTRAVENOUS
  Filled 2016-02-10: qty 10

## 2016-02-10 MED ORDER — ALUM & MAG HYDROXIDE-SIMETH 200-200-20 MG/5ML PO SUSP
30.0000 mL | ORAL | Status: DC | PRN
  Administered 2016-02-10: 30 mL via ORAL
  Filled 2016-02-10: qty 30

## 2016-02-10 NOTE — Progress Notes (Addendum)
PROGRESS NOTE  Patricia [name]  [id number] DOB: [birth year]  DOA: 02/09/2016 PCP: Nyoka Cowden, MD   Brief Narrative:  50 year old female patient, married, works at [employer], [place] of HTN, tobacco abuse, obesity, recently diagnosed migraine, presented to [facility] ED on 02/09/16 following a syncopal episode while at work and was diagnosed with acute bilateral pulmonary embolism with mild right heart strain. CCM evaluated and did not think that she was a candidate for thrombolysis. She was started on IV heparin drip and admitted to stepdown unit for management. She denies history of recent travel, hormone supplements/contraception, recent prolonged immobilization or prior history of VTE. Strong family history of VTE (mother and maternal uncle had unprovoked VTE and patient's sister and brother had VTE-unclear if they were provoked or unprovoked).   Assessment & Plan:   Active Problems:   HEADACHE, TENSION   Tobacco abuse   Pulmonary embolism (HCC)   Hypertension   Hypokalemia   Pulmonary emboli (HCC)   Acute bilateral pulmonary embolism - As per CTA chest on admission, also suggestion of mild right heart strain. She was hemodynamically stable in the ED. CCM evaluated and did not think that she needed thrombolysis. She was admitted to stepdown unit on IV heparin drip. - Strong family history of VTE (mother and maternal uncle had unprovoked VTE and patient's sister and brother had VTE-unclear if they were provoked or unprovoked). - She denies history of recent travel, hormone supplements/contraception, recent prolonged immobilization, personal history of cancer or prior history of VTE. - Has remained hemodynamically stable. Continue IV heparin drip through 9/1 then consider switching to oral anticoagulation. - Discussed in detail with patient and spouse at bedside regarding options of warfarin versus NOAC's including all risks and benefits. They verbalized understanding and yet  to make a decision. - LEV Dopplers: Neg for DVT. - 2-D echo: Normal LVEF. Abnormal relaxation with normal filling pressures. Moderately dilated right ventricle with moderately decreased systolic function and severe pulmonary hypertension. - She will need outpatient  Hematology consultation to further evaluate regarding hypercoagulable state. Her condition may well be Heriditory. Her VTE is unprovoked and hence may require lifelong anticoagulation. Discussed with patient and spouse and they verbalized understanding. - If she declines in the hospital, consult CCM.  Syncope - DD: Secondary to acute PE versus vasovagal. - 2-D echo results as above. Telemetry without arrhythmia's. - monitor  Tobacco abuse - Counseled regarding tobacco cessation especially given hypercoagulable state. Nicotine patch  Essential hypertension - Mildly uncontrolled.  - Holding HCTZ for now. Consider resuming in a.m.  Migraine - no headache reported.  Obesity/Body mass index is 42.37 kg/m. - Recommended diet, exercise and weight loss when able.  Hypokalemia - replaced.  Atypical chest pain,? GI related - Resolved after a dose of Mylanta. EKG: T inversion in inferior leads and V3 V4. No acute changes. - Cycle troponin's. Continue to monitor. Echo report as below.  Prolonged QTC - QTC 492 ms on 8/31. Potassium 4.2 and magnesium 1.9. Continue monitoring on telemetry. Follow EKG in a.m.  Severe pulmonary hypertension - Unclear etiology.? Sleep apnea? Prior asymptomatic VTE    DVT prophylaxis: Currently on IV heparin infusion Code Status: Full Family Communication: Discussed in detail with patient's spouse at bedside. Disposition Plan: Admitted to stepdown unit. Continue management in stepdown unit for additional 24 hours. DC home when medically ready. If she opts for NOAC, she be transitioned in the morning and discharged home tomorrow. If she opts for warfarin, she will need either inpatient  IV heparin  versus home subcutaneous Lovenox bridging and warfarin with arrangements to follow INR as outpatient.   Consultants:   CCM  Procedures:   None  Antimicrobials:   None   Subjective: Seen this morning, stated that she was feeling better. Dyspnea on exertion had improved. She had walked to the bathroom. At that time she did not report any chest pain. Sometime this afternoon, reported chest pain after eating which resolved after Mylanta. No dizziness or lightheadedness reported.  Objective:  Vitals:   02/10/16 0455 02/10/16 0733 02/10/16 1150 02/10/16 1445  BP: (!) 115/99 (!) 137/91 (!) 140/94 (!) 123/93  Pulse: 94 87 96 93  Resp: 12 20 (!) 22 19  Temp: 98.6 F (37 C) 97.8 F (36.6 C) 98.4 F (36.9 C)   TempSrc: Oral Oral Oral   SpO2: 98% 97% 97% 97%  Weight:      Height:        Intake/Output Summary (Last 24 hours) at 02/10/16 1546 Last data filed at 02/10/16 1500  Gross per 24 hour  Intake          3505.06 ml  Output              375 ml  Net          3130.06 ml   Filed Weights   02/09/16 1616 02/09/16 2336  Weight: 112 kg (247 lb) 122.7 kg (270 lb 8.1 oz)    Examination:  General exam: Pleasant young female lying comfortably propped up in bed. Spouse at bedside. Respiratory system: Clear to auscultation. Respiratory effort normal. Cardiovascular system: S1 & S2 heard, RRR. No JVD, murmurs, rubs, gallops or clicks. No pedal edema. Telemetry: ST on admission >SR. Gastrointestinal system: Abdomen is nondistended, soft and nontender. No organomegaly or masses felt. Normal bowel sounds heard. Central nervous system: Alert and oriented. No focal neurological deficits. Extremities: Symmetric 5 x 5 power. Skin: No rashes, lesions or ulcers Psychiatry: Judgement and insight appear normal. Mood & affect appropriate.     Data Reviewed: I have personally reviewed following labs and imaging studies  CBC:  Recent Labs Lab 02/09/16 1628 02/10/16 0153  WBC 8.8 9.1    HGB 13.6 12.7  HCT 41.6 39.5  MCV 90.0 91.2  PLT 256 [id number]   Basic Metabolic Panel:  Recent Labs Lab 02/09/16 1628 02/10/16 0153  NA 137 140  K 3.4* 4.2  CL 105 107  CO2 23 29  GLUCOSE 140* 113*  BUN 15 14  CREATININE 1.07* 1.12*  CALCIUM 9.4 9.0  MG  --  1.9  PHOS  --  4.4   GFR: Estimated Creatinine Clearance: 82.5 mL/min (by C-G formula based on SCr of 1.12 mg/dL). Liver Function Tests:  Recent Labs Lab 02/10/16 0153  AST 46*  ALT 64*  ALKPHOS 80  BILITOT 0.3  PROT 6.0*  ALBUMIN 3.4*   No results for input(s): LIPASE, AMYLASE in the last 168 hours. No results for input(s): AMMONIA in the last 168 hours. Coagulation Profile: No results for input(s): INR, PROTIME in the last 168 hours. Cardiac Enzymes:  Recent Labs Lab 02/09/16 1628  TROPONINI 0.03*   BNP (last 3 results) No results for input(s): PROBNP in the last 8760 hours. HbA1C: No results for input(s): HGBA1C in the last 72 hours. CBG: No results for input(s): GLUCAP in the last 168 hours. Lipid Profile: No results for input(s): CHOL, HDL, LDLCALC, TRIG, CHOLHDL, LDLDIRECT in the last 72 hours. Thyroid Function Tests:  Recent Labs  02/10/16 0154  TSH 1.319   Anemia Panel: No results for input(s): VITAMINB12, FOLATE, FERRITIN, TIBC, IRON, RETICCTPCT in the last 72 hours.  Sepsis Labs: No results for input(s): PROCALCITON, LATICACIDVEN in the last 168 hours.  Recent Results (from the past 240 hour(s))  MRSA PCR Screening     Status: None   Collection Time: 02/10/16  6:51 AM  Result Value Ref Range Status   MRSA by PCR NEGATIVE NEGATIVE Final    Comment:        The GeneXpert MRSA Assay (FDA approved for NASAL specimens only), is one component of a comprehensive MRSA colonization surveillance program. It is not intended to diagnose MRSA infection nor to guide or monitor treatment for MRSA infections.          Radiology Studies: Ct Angio Chest Pe W/cm &/or Wo Cm  Result  Date: 02/09/2016 CLINICAL DATA:  Syncope EXAM: CT ANGIOGRAPHY CHEST WITH CONTRAST TECHNIQUE: Multidetector CT imaging of the chest was performed using the standard protocol during bolus administration of intravenous contrast. Multiplanar CT image reconstructions and MIPs were obtained to evaluate the vascular anatomy. CONTRAST:  100 cc Isovue 370 COMPARISON:  None. FINDINGS: There is acute bilateral pulmonary thromboembolism. Irregular filling defect is present in the peripheral right pulmonary artery extending into lobar and segmental branches. There is also a small amount of thrombus burden in the peripheral left pulmonary artery extending into left upper and lower lobe lobar and segmental branches. Right ventricle the left ventricle ratio is 1.3 consistent with mild right heart strain. No evidence of aortic dissection. No abnormal mediastinal adenopathy. No pneumothorax.  No pleural effusion Scattered minimal subsegmental atelectasis in the lungs. No mass or consolidation. No acute bony deformity. Postcholecystectomy. Review of the MIP images confirms the above findings. IMPRESSION: The study is positive for bilateral acute pulmonary thromboembolism, associated with mild right heart strain. Critical Value/emergent results were called by telephone at the time of interpretation on 02/09/2016 at 6:38 pm to Dr. Carmin Muskrat , who verbally acknowledged these results. Electronically Signed   By: Marybelle Killings M.D.   On: 02/09/2016 18:38        Scheduled Meds: . nicotine  21 mg Transdermal Daily  . sodium chloride flush  3 mL Intravenous Q12H   Continuous Infusions: . sodium chloride 100 mL/hr at 02/10/16 0734  . heparin 1,400 Units/hr (02/10/16 0733)     LOS: 1 day    Time spent: 40 minutes.    [facility], MD [facility] Pager [phone]  If 7PM-7AM, please contact night-coverage [email or web address] Password [email or web address] 02/10/2016, 3:46 PM

## 2016-02-10 NOTE — Progress Notes (Signed)
  Echocardiogram 2D Echocardiogram has been performed.  Jennette Dubin 02/10/2016, 9:01 AM

## 2016-02-10 NOTE — Progress Notes (Signed)
**  Preliminary report by tech**  Bilateral lower extremity venous duplex completed. There is no evidence of deep or superficial vein thrombosis involving the right and left lower extremities. All visualized vessels appear patent and compressible. There is no evidence of Baker's cysts bilaterally.   02/10/16 3:16 PM Patricia [name] RVT

## 2016-02-10 NOTE — Progress Notes (Signed)
ANTICOAGULATION CONSULT NOTE - Follow Up Consult  Pharmacy Consult for Heparin  Indication: pulmonary embolus  No Known Allergies  Patient Measurements: Height: 5\' 7"  (170.2 cm) Weight: 270 lb 8.1 oz (122.7 kg) IBW/kg (Calculated) : 61.6  Vital Signs: Temp: 98.3 F (36.8 C) (08/30 2336) Temp Source: Oral (08/30 2336) BP: 137/96 (08/30 2336) Pulse Rate: 104 (08/30 2205)  Labs:  Recent Labs  02/09/16 1628 02/10/16 0153 02/10/16 0154  HGB 13.6 12.7  --   HCT 41.6 39.5  --   PLT 256 228  --   HEPARINUNFRC  --   --  0.70  CREATININE 1.07*  --   --   TROPONINI 0.03*  --   --     Estimated Creatinine Clearance: 86.3 mL/min (by C-G formula based on SCr of 1.07 mg/dL).   Assessment: 50 y/o F with new onset PE, initial heparin level is therapeutic  Goal of Therapy:  Heparin level 0.3-0.7 units/ml Monitor platelets by anticoagulation protocol: Yes   Plan:  -Cont heparin at 1400 units/hr -1000 HL  Narda Bonds 02/10/2016,2:24 AM

## 2016-02-10 NOTE — Care Management Note (Addendum)
Case Management Note  Patient Details  Name: Patricia [name] MRN: [id number] Date of Birth: [birth year]  Subjective/Objective:       Pulmonary Embolus   Action/Plan:  Referral- Medication Assistance   NCM spoke to pt and husband at bedside. Pt states she has BCBS with her job. States she works full-time. Will need note for work. She has 200 hours sick time available. Will follow up with her Human Resources. Pt states the medication options for therapy post dc was discussed. Coumadin/Lovenox vs Xarelto. States she prefers to dc home with Lovenox/Coumadin. She see Dr. Bluford Kaufmann MD at [facility]. Explained [facility] if dc home with Coumadin. Explained Xarelto has 30 day free trial card, with a $0 copay card. And Lovenox/coumadin have generic and are covered by [facility]. Explained NCM will do benefits check once we have doses available for Lovenox and Coumadin.   PCP, Dr Burnice Logan follow up appt scheduled for 02/23/2016 at 11:15 am. Pt will go to that office to have PT/INR drawn on Mon or Wed. Only days for Cou[facility] Clinic # [phone], will have to call and schedule appt to have labs drawn.   PCP- Marletta Lor MD  Expected Discharge Date:  02/12/16               Expected Discharge Plan:  Home/Self Care  In-House Referral:  NA  Discharge planning Services  CM Consult  Post Acute Care Choice:  NA Choice offered to:  NA  DME Arranged:  N/A DME Agency:  NA  HH Arranged:  NA HH Agency:  NA  Status of Service:  In process, will continue to follow  If discussed at Long Length of Stay Meetings, dates discussed:    Additional Comments:  Erenest Rasher, RN 02/10/2016, 3:34 PM

## 2016-02-10 NOTE — Progress Notes (Signed)
Patient c/o chest pain, mid-sternum, non radiating, feels like pressure, 8/10. Patient states it began after eating lunch. Vitals taken and stable 123/93, HR 93, RR 19, O2 97% RA. EKG performed. MD notified. New orders received. Will cont to monitor.

## 2016-02-10 NOTE — Progress Notes (Signed)
ANTICOAGULATION CONSULT NOTE - Follow Up Consult  Pharmacy Consult for Heparin  Indication: pulmonary embolus  No Known Allergies  Patient Measurements: Height: 5\' 7"  (170.2 cm) Weight: 270 lb 8.1 oz (122.7 kg) IBW/kg (Calculated) : 61.6  Vital Signs: Temp: 97.8 F (36.6 C) (08/31 0733) Temp Source: Oral (08/31 0733) BP: 137/91 (08/31 0733) Pulse Rate: 87 (08/31 0733)  Labs:  Recent Labs  02/09/16 1628 02/10/16 0153 02/10/16 0154 02/10/16 0930  HGB 13.6 12.7  --   --   HCT 41.6 39.5  --   --   PLT 256 228  --   --   HEPARINUNFRC  --   --  0.70 0.66  CREATININE 1.07* 1.12*  --   --   TROPONINI 0.03*  --   --   --     Estimated Creatinine Clearance: 82.5 mL/min (by C-G formula based on SCr of 1.12 mg/dL).   Assessment: 50 y/o F with new onset PE, initial heparin level was therapeutic (0.70) at 0153 on 8/31.  8/31 0930 HL: 0.66  Goal of Therapy:  Heparin level 0.3-0.7 units/ml Monitor platelets by anticoagulation protocol: Yes   Plan:  Two heparin  levels now therapeutic. Will cont heparin at 1400 units/hr F/U heparin levels with am labs  Nancy Fetter, PharmD Clinical Pharmacist 02/10/2016 10:23 AM

## 2016-02-11 DIAGNOSIS — G44209 Tension-type headache, unspecified, not intractable: Secondary | ICD-10-CM

## 2016-02-11 DIAGNOSIS — I10 Essential (primary) hypertension: Secondary | ICD-10-CM

## 2016-02-11 LAB — CBC
HEMATOCRIT: 37.4 % (ref 36.0–46.0)
Hemoglobin: 11.9 g/dL — ABNORMAL LOW (ref 12.0–15.0)
MCH: 29.2 pg (ref 26.0–34.0)
MCHC: 31.8 g/dL (ref 30.0–36.0)
MCV: 91.7 fL (ref 78.0–100.0)
Platelets: 244 10*3/uL (ref 150–400)
RBC: 4.08 MIL/uL (ref 3.87–5.11)
RDW: 13.9 % (ref 11.5–15.5)
WBC: 7.6 10*3/uL (ref 4.0–10.5)

## 2016-02-11 LAB — TROPONIN I
Troponin I: <0.03 ng/mL (ref <0.03)
Troponin I: <0.03 ng/mL (ref <0.03)

## 2016-02-11 LAB — HEPARIN LEVEL (UNFRACTIONATED): Heparin Unfractionated: 0.57 IU/mL (ref 0.30–0.70)

## 2016-02-11 MED ORDER — RIVAROXABAN 20 MG PO TABS: 20.0000 mg | ORAL_TABLET | Freq: Every day | ORAL | Status: DC

## 2016-02-11 MED ORDER — RIVAROXABAN (XARELTO) EDUCATION KIT FOR DVT/PE PATIENTS
PACK | Freq: Once | Status: AC
  Administered 2016-02-11: 17:00:00
  Filled 2016-02-11: qty 1

## 2016-02-11 MED ORDER — RIVAROXABAN 15 MG PO TABS
15.0000 mg | ORAL_TABLET | Freq: Two times a day (BID) | ORAL | Status: DC
  Administered 2016-02-11 – 2016-02-12 (×3): 15 mg via ORAL
  Filled 2016-02-11 (×3): qty 1

## 2016-02-11 MED ORDER — HYDRALAZINE HCL 20 MG/ML IJ SOLN: 10.0000 mg | Freq: Four times a day (QID) | INTRAMUSCULAR | Status: DC | PRN

## 2016-02-11 MED ORDER — HYDROCHLOROTHIAZIDE 25 MG PO TABS
25.0000 mg | ORAL_TABLET | Freq: Every day | ORAL | Status: DC
  Administered 2016-02-11 – 2016-02-12 (×2): 25 mg via ORAL
  Filled 2016-02-11 (×2): qty 1

## 2016-02-11 NOTE — Progress Notes (Addendum)
[facility]                                                                              Patient Demographics  Patricia [name], is a 50 y.o. female, DOB - [birth year], [id number]  Admit date - 02/09/2016   Admitting Physician Toy Baker, MD  Outpatient Primary MD for the patient is Nyoka Cowden, MD  Outpatient specialists:   LOS - 2  days    Chief Complaint  Patient presents with  . Loss of Consciousness       Brief summary   50 year old female patient, married, works at [employer], [place] of HTN, tobacco abuse, obesity, recently diagnosed migraine, presented to [facility] ED on 02/09/16 following a syncopal episode while at work and was diagnosed with acute bilateral pulmonary embolism with mild right heart strain. CCM evaluated and did not think that she was a candidate for thrombolysis. She was started on IV heparin drip and admitted to stepdown unit for management. She denies history of recent travel, hormone supplements/contraception, recent prolonged immobilization or prior history of VTE. Strong family history of VTE (mother and maternal uncle had unprovoked VTE and patient's sister and brother had VTE-unclear if they were provoked or unprovoked).   Assessment & Plan    Acute bilateral pulmonary embolism- As per CTA chest on admission, also suggestion of mild right heart strain. She was hemodynamically stable in the ED. CCM evaluated and did not think that she needed thrombolysis. She was admitted to stepdown unit on IV heparin drip. - Strong family history of VTE (mother and maternal uncle had unprovoked VTE and patient's sister and brother had VTE-unclear if they were provoked or unprovoked).  She denied history of recent travel, hormone supplements/contraception, recent prolonged immobilization, personal history of cancer or prior history of VTE. - Has remained hemodynamically stable.  - I discussed in detail with patient and spouse at  bedside regarding options of warfarin versus NOAC's including all risks and benefits. Patient and her husband now are agreeable to xarelto. Pharmacy consult placed for xarelto, will DC heparin drip after that. - LEV Dopplers: Neg for DVT. - 2-D echo: Normal LVEF. Abnormal relaxation with normal filling pressures. Moderately dilated right ventricle with moderately decreased systolic function and severe pulmonary hypertension. - She will need outpatient hematology consultation to further evaluate regarding hypercoagulable state, her condition  may well be  Her VTE is unprovoked and hence may require lifelong anticoagulation. Discussed with patient and spouse and they verbalized understanding. I discussed with Dr Lindi Adie who agreed with hematology evaluation, recommended outpatient appointment, will send information to Dr Lindi Adie who will arrange the appointment.   Syncope - DD: Secondary to acute PE versus vasovagal. - 2-D echo results as above. Telemetry without arrhythmia's.  Tobacco abuse - Counseled regarding tobacco cessation especially given hypercoagulable state. Nicotine patch  Essential hypertension - Mildly uncontrolled.  - Restart HCTZ, hydralazine IV as needed  Migraine - no headache reported.  Obesity/Body mass index is 42.37 kg/m. - Recommended diet, exercise and weight loss when able.  Hypokalemia - replaced.  Atypical chest pain,? GI related - Resolved after a dose of Mylanta. EKG: T  inversion in inferior leads and V3 V4. No acute changes. - Repeat troponins negative   Prolonged QTC - QTC 492 ms on 8/31.   Severe pulmonary hypertension - Unclear etiology.? Sleep apnea? Prior asymptomatic VTE or ?CTEPH or idiopathic. Patient will need outpatient pulmonary consultation for further workup.  Code Status: Full CODE STATUS DVT Prophylaxis:  Heparin Family Communication: Discussed in detail with the patient, all imaging results, lab results explained to the  patient and her husband at bedside   Disposition Plan: Likely in a.m.  Time Spent in minutes   45 minutes,   Procedures:  2-D echo Impressions:  - Normal LVEF. Abnormal relaxation with normal filling pressures.   Moderately dilated rightventricle with moderately decreased   systolic function and severe pulmonary hypertension.  Doppler ultrasound of the lower extremities: Negative for DVT  Consultants:   PCCM  Antimicrobials:  None   Medications  Scheduled Meds: . hydrochlorothiazide  25 mg Oral Daily  . nicotine  21 mg Transdermal Daily  . rivaroxaban   Does not apply Once  . Rivaroxaban  15 mg Oral BID WC  . [START ON 03/03/2016] rivaroxaban  20 mg Oral Q supper  . sodium chloride flush  3 mL Intravenous Q12H   Continuous Infusions:  PRN Meds:.acetaminophen **OR** acetaminophen, alum & mag hydroxide-simeth, hydrALAZINE, HYDROcodone-acetaminophen, ondansetron **OR** ondansetron (ZOFRAN) IV   Antibiotics   Anti-infectives    None        Subjective:   Patricia [name] was seen and examined today.  Patient denies dizziness, chest pain, shortness of breath, abdominal pain, N/V/D/C, new weakness, numbess, tingling. No acute events overnight.    Objective:   Vitals:   02/10/16 1934 02/10/16 2249 02/11/16 0338 02/11/16 0747  BP: (!) 139/95 128/85 (!) 143/96 (!) 153/102  Pulse: (!) 102 (!) 101 81 85  Resp: 19 (!) 22 12 18   Temp: 98.7 F (37.1 C) 98.8 F (37.1 C) 98.6 F (37 C) 97.7 F (36.5 C)  TempSrc: Oral Oral Oral Oral  SpO2: 99% 92% 100% 100%  Weight:      Height:        Intake/Output Summary (Last 24 hours) at 02/11/16 1138 Last data filed at 02/11/16 0506  Gross per 24 hour  Intake           1667.4 ml  Output              400 ml  Net           1267.4 ml     Wt Readings from Last 3 Encounters:  02/09/16 122.7 kg (270 lb 8.1 oz)  01/20/16 124.4 kg (274 lb 4.8 oz)  01/08/15 124.7 kg (275 lb)     Exam  General: Alert and oriented x 3,  NAD  HEENT:  PERRLA, EOMI, Anicteric Sclera, mucous membranes moist.   Neck: Supple, no JVD, no masses  Cardiovascular: S1 S2 auscultated, no rubs, murmurs or gallops. Regular rate and rhythm.  Respiratory: Clear to auscultation bilaterally, no wheezing, rales or rhonchi  Gastrointestinal: Soft, nontender, nondistended, + bowel sounds  Ext: no cyanosis clubbing or edema  Neuro: AAOx3, Cr N's II- XII. Strength 5/5 upper and lower extremities bilaterally  Skin: No rashes  Psych: Normal affect and demeanor, alert and oriented x3    Data Reviewed:  I have personally reviewed following labs and imaging studies  Micro Results Recent Results (from the past 240 hour(s))  MRSA PCR Screening     Status: None   Collection Time:  02/10/16  6:51 AM  Result Value Ref Range Status   MRSA by PCR NEGATIVE NEGATIVE Final    Comment:        The GeneXpert MRSA Assay (FDA approved for NASAL specimens only), is one component of a comprehensive MRSA colonization surveillance program. It is not intended to diagnose MRSA infection nor to guide or monitor treatment for MRSA infections.     Radiology Reports Ct Angio Chest Pe W/cm &/or Wo Cm  Result Date: 02/09/2016 CLINICAL DATA:  Syncope EXAM: CT ANGIOGRAPHY CHEST WITH CONTRAST TECHNIQUE: Multidetector CT imaging of the chest was performed using the standard protocol during bolus administration of intravenous contrast. Multiplanar CT image reconstructions and MIPs were obtained to evaluate the vascular anatomy. CONTRAST:  100 cc Isovue 370 COMPARISON:  None. FINDINGS: There is acute bilateral pulmonary thromboembolism. Irregular filling defect is present in the peripheral right pulmonary artery extending into lobar and segmental branches. There is also a small amount of thrombus burden in the peripheral left pulmonary artery extending into left upper and lower lobe lobar and segmental branches. Right ventricle the left ventricle ratio is 1.3  consistent with mild right heart strain. No evidence of aortic dissection. No abnormal mediastinal adenopathy. No pneumothorax.  No pleural effusion Scattered minimal subsegmental atelectasis in the lungs. No mass or consolidation. No acute bony deformity. Postcholecystectomy. Review of the MIP images confirms the above findings. IMPRESSION: The study is positive for bilateral acute pulmonary thromboembolism, associated with mild right heart strain. Critical Value/emergent results were called by telephone at the time of interpretation on 02/09/2016 at 6:38 pm to Dr. Carmin Muskrat , who verbally acknowledged these results. Electronically Signed   By: Marybelle Killings M.D.   On: 02/09/2016 18:38    Lab Data:  CBC:  Recent Labs Lab 02/09/16 1628 02/10/16 0153 02/11/16 0504  WBC 8.8 9.1 7.6  HGB 13.6 12.7 11.9*  HCT 41.6 39.5 37.4  MCV 90.0 91.2 91.7  PLT 256 228 [id number]   Basic Metabolic Panel:  Recent Labs Lab 02/09/16 1628 02/10/16 0153  NA 137 140  K 3.4* 4.2  CL 105 107  CO2 23 29  GLUCOSE 140* 113*  BUN 15 14  CREATININE 1.07* 1.12*  CALCIUM 9.4 9.0  MG  --  1.9  PHOS  --  4.4   GFR: Estimated Creatinine Clearance: 82.5 mL/min (by C-G formula based on SCr of 1.12 mg/dL). Liver Function Tests:  Recent Labs Lab 02/10/16 0153  AST 46*  ALT 64*  ALKPHOS 80  BILITOT 0.3  PROT 6.0*  ALBUMIN 3.4*   No results for input(s): LIPASE, AMYLASE in the last 168 hours. No results for input(s): AMMONIA in the last 168 hours. Coagulation Profile: No results for input(s): INR, PROTIME in the last 168 hours. Cardiac Enzymes:  Recent Labs Lab 02/09/16 1628 02/10/16 1712 02/10/16 2325 02/11/16 0504  TROPONINI 0.03* 0.03* <0.03 <0.03   BNP (last 3 results) No results for input(s): PROBNP in the last 8760 hours. HbA1C: No results for input(s): HGBA1C in the last 72 hours. CBG: No results for input(s): GLUCAP in the last 168 hours. Lipid Profile: No results for input(s):  CHOL, HDL, LDLCALC, TRIG, CHOLHDL, LDLDIRECT in the last 72 hours. Thyroid Function Tests:  Recent Labs  02/10/16 0154  TSH 1.319   Anemia Panel: No results for input(s): VITAMINB12, FOLATE, FERRITIN, TIBC, IRON, RETICCTPCT in the last 72 hours. Urine analysis:    Component Value Date/Time   COLORURINE YELLOW 02/09/2016 2228   APPEARANCEUR  CLEAR 02/09/2016 2228   LABSPEC 1.038 (H) 02/09/2016 2228   PHURINE 5.5 02/09/2016 2228   GLUCOSEU NEGATIVE 02/09/2016 2228   HGBUR NEGATIVE 02/09/2016 2228   BILIRUBINUR NEGATIVE 02/09/2016 2228   BILIRUBINUR neg 01/01/2015 1107   KETONESUR NEGATIVE 02/09/2016 2228   PROTEINUR NEGATIVE 02/09/2016 2228   UROBILINOGEN 0.2 01/01/2015 1107   UROBILINOGEN 0.2 01/17/2009 1516   NITRITE NEGATIVE 02/09/2016 2228   LEUKOCYTESUR NEGATIVE 02/09/2016 2228     , M.D. [facility] 02/11/2016, 11:38 AM  Pager: [phone] Between 7am to 7pm - call Pager - [phone]  After 7pm go to [email or web address] - password [email or web address]  Call night coverage person covering after 7pm

## 2016-02-11 NOTE — Progress Notes (Addendum)
ANTICOAGULATION CONSULT NOTE - Follow Up Consult  ADDENDUM:  Transition heparin >> Xarelto for bilateral PE. Hgb trending down since admission 13.6>12.7>11.9. Plt stable at 244. No bleeding reported  Plan: -D/C heparin -Start Xarelto 15mg  BID x21 days starting 9/1 followed by 20mg  daily starting 03/03/2016 -Monitor s/sx bleeding  ________________________________________________________________________   Pharmacy Consult for Heparin  Indication: pulmonary embolus  No Known Allergies  Patient Measurements: Height: 5\' 7"  (170.2 cm) Weight: 270 lb 8.1 oz (122.7 kg) IBW/kg (Calculated) : 61.6 Heparin dosing weight 87.5 kg  Vital Signs: Temp: 97.7 F (36.5 C) (09/01 0747) Temp Source: Oral (09/01 0747) BP: 153/102 (09/01 0747) Pulse Rate: 85 (09/01 0747)  Labs:  Recent Labs  02/09/16 1628 02/10/16 0153 02/10/16 0154 02/10/16 0930 02/10/16 1712 02/10/16 2325 02/11/16 0504  HGB 13.6 12.7  --   --   --   --  11.9*  HCT 41.6 39.5  --   --   --   --  37.4  PLT 256 228  --   --   --   --  244  HEPARINUNFRC  --   --  0.70 0.66  --   --  0.57  CREATININE 1.07* 1.12*  --   --   --   --   --   TROPONINI 0.03*  --   --   --  0.03* <0.03 <0.03    Estimated Creatinine Clearance: 82.5 mL/min (by C-G formula based on SCr of 1.12 mg/dL).   Assessment: 50 y/o F continues on heparin for bilateral PE. No PTA anti-coag. VAS [place] neg for DVT.   HL remains therapeutic today at 0.57. Hgb trending down since admission - 13.6>12.7>11.9. Plt stable at 244. No bleeding reported.   Goal of Therapy:  Heparin level 0.3-0.7 units/ml Monitor platelets by anticoagulation protocol: Yes   Plan:  -Continue heparin at 1400 units/hr  -Daily HL/CBC -Monitor s/sx bleeding -F/U long-term [facility] plans   Stephens November, PharmD Clinical Pharmacist 9:53 AM, 02/11/2016

## 2016-02-11 NOTE — Progress Notes (Signed)
S/W [facility] [place] # [phone] OPT-1   1. XARELTO 15 MG BID X 21 DAYS  (30 )    20 MG DAILY   COVER- YES                             YES  CO-PAY- $ 35.00                          Same  Tier- 2 DRUG                             SAME  PRIOR APPROVAL NO                     SAME   2 ELIQUIS  5 MG BID     (30 ) 60 TAB        2.5 MG BID   COVER- YES                        YES  CO-PAY- $ 35.00                     SAME  TIER- 2 DRUG                       SAME  PRIOR APPROVAL - NO               NO   PHARMACY : [facility] , [facility] AND [facility]

## 2016-02-11 NOTE — Care Management Note (Signed)
Case Management Note  Patient Details  Name: Patricia [name] MRN: [id number] Date of Birth: Feb 15, 1966  Subjective/Objective:  Patient lives with spouse, she presents with Bil PE's, on heparin drip, she is indep pta, she has pcp, she has medication coverage and transport at dc.  NCM gave patient xarelto 30 day savings card and 0 co pay cards, she goes to pharmacy [facility] on [address] in [place], they do have 30 xarelto pills and will need to order the rest. awaiting benefit check. NCM will cont to follow for dc needs.                   Action/Plan:   Expected Discharge Date:  02/12/16               Expected Discharge Plan:  Home/Self Care  In-House Referral:  NA  Discharge planning Services  CM Consult, Medication Assistance  Post Acute Care Choice:  NA Choice offered to:  NA  DME Arranged:  N/A DME Agency:  NA  HH Arranged:    [place] Agency:     Status of Service:  In process, will continue to follow  If discussed at Long Length of Stay Meetings, dates discussed:    Additional Comments:  [name], RN 02/11/2016, 11:56 AM

## 2016-02-11 NOTE — Discharge Instructions (Signed)
Information on my medicine - XARELTO (rivaroxaban)  This medication education was reviewed with me or my healthcare representative as part of my discharge preparation.  The pharmacist that spoke with me during my hospital stay was:  Judieth Keens, [place]? Xarelto was prescribed to treat blood clots that may have been found in the veins of your legs (deep vein thrombosis) or in your lungs (pulmonary embolism) and to reduce the risk of them occurring again.  What do you need to know about Xarelto? The starting dose is one 15 mg tablet taken TWICE daily with food for the FIRST 21 DAYS then on (enter date) 03/03/2016 the dose is changed to one 20 mg tablet taken ONCE A DAY with your evening meal.  DO NOT stop taking Xarelto without talking to the health care provider who prescribed the medication.  Refill your prescription for 20 mg tablets before you run out.  After discharge, you should have regular check-up appointments with your healthcare provider that is prescribing your Xarelto.  In the future your dose may need to be changed if your kidney function changes by a significant amount.  What do you do if you miss a dose? If you are taking Xarelto TWICE DAILY and you miss a dose, take it as soon as you remember. You may take two 15 mg tablets (total 30 mg) at the same time then resume your regularly scheduled 15 mg twice daily the next day.  If you are taking Xarelto ONCE DAILY and you miss a dose, take it as soon as you remember on the same day then continue your regularly scheduled once daily regimen the next day. Do not take two doses of Xarelto at the same time.   Important Safety Information Xarelto is a blood thinner medicine that can cause bleeding. You should call your healthcare provider right away if you experience any of the following: ? Bleeding from an injury or your nose that does not stop. ? Unusual colored urine (red or dark brown) or  unusual colored stools (red or black). ? Unusual bruising for unknown reasons. ? A serious fall or if you hit your head (even if there is no bleeding).  Some medicines may interact with Xarelto and might increase your risk of bleeding while on Xarelto. To help avoid this, consult your healthcare provider or pharmacist prior to using any new prescription or non-prescription medications, including herbals, vitamins, non-steroidal anti-inflammatory drugs (NSAIDs) and supplements.  This website has more information on Xarelto: [email or web address]

## 2016-02-12 DIAGNOSIS — E876 Hypokalemia: Secondary | ICD-10-CM

## 2016-02-12 LAB — CBC
HCT: 38.2 % (ref 36.0–46.0)
Hemoglobin: 12.1 g/dL (ref 12.0–15.0)
MCH: 29.3 pg (ref 26.0–34.0)
MCHC: 31.7 g/dL (ref 30.0–36.0)
MCV: 92.5 fL (ref 78.0–100.0)
Platelets: 246 10*3/uL (ref 150–400)
RBC: 4.13 MIL/uL (ref 3.87–5.11)
RDW: 14 % (ref 11.5–15.5)
WBC: 8.1 10*3/uL (ref 4.0–10.5)

## 2016-02-12 MED ORDER — NICOTINE 21 MG/24HR TD PT24: 21.0000 mg | MEDICATED_PATCH | Freq: Every day | TRANSDERMAL | 1 refills | Status: DC

## 2016-02-12 MED ORDER — RIVAROXABAN (XARELTO) VTE STARTER PACK (15 & 20 MG): ORAL_TABLET | ORAL | 0 refills | Status: DC

## 2016-02-12 MED ORDER — RIVAROXABAN 20 MG PO TABS: 20.0000 mg | ORAL_TABLET | Freq: Every day | ORAL | 6 refills | Status: DC

## 2016-02-12 MED ORDER — AMLODIPINE BESYLATE 5 MG PO TABS: 5.0000 mg | ORAL_TABLET | Freq: Every day | ORAL | 2 refills | Status: DC

## 2016-02-12 MED ORDER — AMLODIPINE BESYLATE 5 MG PO TABS
5.0000 mg | ORAL_TABLET | Freq: Every day | ORAL | Status: DC
  Administered 2016-02-12: 5 mg via ORAL
  Filled 2016-02-12: qty 1

## 2016-02-12 NOTE — Progress Notes (Addendum)
Patient and spouse given discharge instructions medication list, follow up appointments, and paper prescriptions. Patient has received Xarelto card from CM. IV and telemetry was DCd. Patient also with family emergency now needing to travel out of town. Dr.Rai Made aware and to call into room for recommendations for travel. will discharge home as ordered. , Bettina Gavia RN

## 2016-02-12 NOTE — Discharge Summary (Signed)
Physician Discharge Summary   Patient ID: Patricia [name] MRN: [id number] DOB/AGE: [birth year] 50 y.o.  Admit date: 02/09/2016 Discharge date: 02/12/2016  Primary Care Physician:  Nyoka Cowden, MD  Discharge Diagnoses:    . Pulmonary embolism ([place]) . HEADACHE, TENSION . Tobacco abuse . Hypertension . Hypokalemia . severe Pulmonary HTN   Consults:  PCCM  Recommendations for Outpatient Follow-up:  1. The patient was started on xarelto 2. Please repeat CBC/BMET at next visit 3. I have discussed with hematology, patient will be scheduled for outpatient appointment with Dr. Lindi Adie for unprovoked PE and possibility of genetic factor given strong family history 4. Patient will need  pulmonology referral for severe pulmonary hypertension   DIET: heart healthy diet     Allergies:  No Known Allergies   DISCHARGE MEDICATIONS: Discharge Medication List as of 02/12/2016  9:49 AM    START taking these medications   Details  amLODipine (NORVASC) 5 MG tablet Take 1 tablet (5 mg total) by mouth daily., Starting Sat 02/12/2016, Print    nicotine (NICODERM CQ - DOSED IN MG/24 HOURS) 21 mg/24hr patch Place 1 patch (21 mg total) onto the skin daily., Starting Sat 02/12/2016, Print    rivaroxaban (XARELTO) 20 MG TABS tablet Take 1 tablet (20 mg total) by mouth daily with supper. Start on 03/03/16, Starting Fri 03/03/2016, Print    Rivaroxaban 15 & 20 MG TBPK Take as directed on package: Start with one 15mg  tablet by mouth twice a day with food. On Day 22 (03/03/16), switch to one 20mg  tablet once a day with food., Print      CONTINUE these medications which have NOT CHANGED   Details  hydrochlorothiazide (HYDRODIURIL) 25 MG tablet Take 1 tablet (25 mg total) by mouth daily., Starting Thu 01/20/2016, Normal    ondansetron (ZOFRAN-ODT) 4 MG disintegrating tablet Take 4 mg by mouth every 4 (four) hours as needed for nausea/vomiting., Starting Thu 12/16/2015, Historical Med     SUMAtriptan (IMITREX) 25 MG tablet Please take one tablet at the onset of a migraine. May repeat once in 2 hours if headache persists or recurs., Normal    CHANTIX CONTINUING MONTH PAK 1 MG tablet TAKE 1 TABLET BY MOUTH TWICE DAILY, Normal      STOP taking these medications     ibuprofen (ADVIL,MOTRIN) 200 MG tablet      buPROPion (WELLBUTRIN SR) 150 MG 12 hr tablet          Brief H and P: For complete details please refer to admission H and P, but in brief 50 year old female patient, married, works at [employer], [place] of HTN, tobacco abuse, obesity, recently diagnosed migraine, presented [facility] ED on 02/09/16 following a syncopal episode while at work and was diagnosed with acute bilateral pulmonary embolism with mild right heart strain. CCM evaluated and did not think that she was a candidate for thrombolysis. She was started on IV heparin drip and admitted to stepdown unit for management. She denies history of recent travel, hormone supplements/contraception, recent prolonged immobilization or prior history of VTE. Strong family history of VTE (mother and maternal uncle had unprovoked VTE and patient's sister and brother had VTE-unclear if they were provoked or unprovoked).   Hospital Course:  Acute bilateral pulmonary embolism- As per CTA chest on admission, also suggestion of mild right heart strain. She was hemodynamically stable in the ED. CCM evaluated and did not think that she needed thrombolysis. She was admitted to stepdown unit on IV heparin drip. -  Strong family history of VTE (mother and maternal uncle had unprovoked VTE and patient's sister and brother had VTE-unclear if they were provoked or unprovoked).  She denied history of recent travel, hormone supplements/contraception, recent prolonged immobilization, personal history of cancer or prior history of VTE. - Has remained hemodynamically stable.  - I discussed in detail with patient and spouse at bedside regarding  options of warfarin versus NOAC's including all risks and benefits. Patient and her husband now are agreeable to xarelto. Patient is now started on xarelto. Heparin drip has been discontinued. - LEV Dopplers: Neg for DVT. - 2-D echo: Normal LVEF. Abnormal relaxation with normal filling pressures. Moderately dilated right ventricle with moderately decreased systolic function and severe pulmonary hypertension. - She will need outpatient hematology consultation to further evaluate regarding hypercoagulable state, her condition  may well be  Her VTE is unprovoked and hence may require lifelong anticoagulation. Discussed with patient and spouse and they verbalized understanding. I discussed with Dr Lindi Adie who agreed with hematology evaluation, recommended outpatient appointment,sent information to Dr Lindi Adie who will arrange the appointment.   Syncope - Secondary to acute PE versus vasovagal. - 2-D echo results as above. Telemetry without arrhythmia's.  Tobacco abuse - Counseled regarding tobacco cessation especially given hypercoagulable state. Nicotine patch  Essential hypertension - Restarted HCTZ, also placed on Norvasc  Migraine - no headache reported.  Obesity/Body mass index is 42.37 kg/m. - Recommended diet, exercise and weight loss when able.  Atypical chest pain,? GI related - Resolved after a dose of Mylanta. EKG: T inversion in inferior leads and V3 V4. No acute changes. - Repeat troponins negative   Prolonged QTC - QTC 492 ms on 8/31.   Severe pulmonary hypertension - Unclear etiology.? Sleep apnea? Prior asymptomatic VTE or ?CTEPH or idiopathic. Patient will need outpatient pulmonary consultation for further workup.    Day of Discharge BP (!) 156/95   Pulse 86   Temp 98 F (36.7 C) (Oral)   Resp 20   Ht 5\' 7"  (1.702 m)   Wt 122.7 kg (270 lb 8.1 oz)   SpO2 99%   BMI 42.37 kg/m   Physical Exam: General: Alert and awake oriented x3 not in any acute  distress. HEENT: anicteric sclera, pupils reactive to light and accommodation CVS: S1-S2 clear no murmur rubs or gallops Chest: clear to auscultation bilaterally, no wheezing rales or rhonchi Abdomen: soft nontender, nondistended, normal bowel sounds Extremities: no cyanosis, clubbing or edema noted bilaterally Neuro: Cranial nerves II-XII intact, no focal neurological deficits   The results of significant diagnostics from this hospitalization (including imaging, microbiology, ancillary and laboratory) are listed below for reference.    LAB RESULTS: Basic Metabolic Panel:  Recent Labs Lab 02/09/16 1628 02/10/16 0153  NA 137 140  K 3.4* 4.2  CL 105 107  CO2 23 29  GLUCOSE 140* 113*  BUN 15 14  CREATININE 1.07* 1.12*  CALCIUM 9.4 9.0  MG  --  1.9  PHOS  --  4.4   Liver Function Tests:  Recent Labs Lab 02/10/16 0153  AST 46*  ALT 64*  ALKPHOS 80  BILITOT 0.3  PROT 6.0*  ALBUMIN 3.4*   No results for input(s): LIPASE, AMYLASE in the last 168 hours. No results for input(s): AMMONIA in the last 168 hours. CBC:  Recent Labs Lab 02/11/16 0504 02/12/16 0226  WBC 7.6 8.1  HGB 11.9* 12.1  HCT 37.4 38.2  MCV 91.7 92.5  PLT 244 246   Cardiac Enzymes:  Recent Labs Lab 02/10/16 2325 02/11/16 0504  TROPONINI <0.03 <0.03   BNP: Invalid input(s): POCBNP CBG: No results for input(s): GLUCAP in the last 168 hours.  Significant Diagnostic Studies:  Ct Angio Chest Pe W/cm &/or Wo Cm  Result Date: 02/09/2016 CLINICAL DATA:  Syncope EXAM: CT ANGIOGRAPHY CHEST WITH CONTRAST TECHNIQUE: Multidetector CT imaging of the chest was performed using the standard protocol during bolus administration of intravenous contrast. Multiplanar CT image reconstructions and MIPs were obtained to evaluate the vascular anatomy. CONTRAST:  100 cc Isovue 370 COMPARISON:  None. FINDINGS: There is acute bilateral pulmonary thromboembolism. Irregular filling defect is present in the peripheral  right pulmonary artery extending into lobar and segmental branches. There is also a small amount of thrombus burden in the peripheral left pulmonary artery extending into left upper and lower lobe lobar and segmental branches. Right ventricle the left ventricle ratio is 1.3 consistent with mild right heart strain. No evidence of aortic dissection. No abnormal mediastinal adenopathy. No pneumothorax.  No pleural effusion Scattered minimal subsegmental atelectasis in the lungs. No mass or consolidation. No acute bony deformity. Postcholecystectomy. Review of the MIP images confirms the above findings. IMPRESSION: The study is positive for bilateral acute pulmonary thromboembolism, associated with mild right heart strain. Critical Value/emergent results were called by telephone at the time of interpretation on 02/09/2016 at 6:38 pm to Dr. Carmin Muskrat , who verbally acknowledged these results. Electronically Signed   By: Marybelle Killings M.D.   On: 02/09/2016 18:38    2D ECHO: Study Conclusions  - Left ventricle: The cavity size was normal. There was moderate   concentric hypertrophy. Systolic function was normal. Wall motion   was normal; there were no regional wall motion abnormalities.   Doppler parameters are consistent with abnormal left ventricular   relaxation (grade 1 diastolic dysfunction). There was no evidence   of elevated ventricular filling pressure by Doppler parameters. - Aortic valve: Trileaflet; normal thickness leaflets. There was no   regurgitation. - Aortic root: The aortic root was normal in size. - Ascending aorta: The ascending aorta was normal in size. - Mitral valve: Structurally normal valve. There was no   regurgitation. - Right ventricle: The cavity size was mildly dilated. Wall   thickness was normal. Systolic function was moderately reduced. - Right atrium: The atrium was normal in size. - Tricuspid valve: There was moderate regurgitation. - Pulmonary arteries:  Systolic pressure was severely increased. PA   peak pressure: 76 mm Hg (S). - Inferior vena cava: The vessel was dilated. The respirophasic   diameter changes were blunted (< 50%), consistent with elevated   central venous pressure. - Pericardium, extracardiac: There was no pericardial effusion.  Impressions:  - Normal LVEF. Abnormal relaxation with normal filling pressures.   Moderately dilated rightventricle with moderately decreased   systolic function and severe pulmonary hypertension.  Disposition and Follow-up: Discharge Instructions    Diet - low sodium heart healthy    Complete by:  As directed   Discharge instructions    Complete by:  As directed   Please continue xarelto 15mg  twice a day till 03/02/16 On, day 22, 03/03/16, please change to xarelto 20mg  daily with supper.  Recommend echo in 3 months and pulmonology referral-please discuss with Dr Ann Lions  Avoid Aspirin, alleve/motrin/ibuprofen   Increase activity slowly    Complete by:  As directed       DISPOSITION:  Home    DISCHARGE FOLLOW-UP Follow-up Information    Nyoka Cowden, MD .  Specialty:  Internal Medicine Why:  appointment 02/23/2016 at 11:15 am Contact information: [place] [address] [phone]        Rulon Eisenmenger, MD. Go today.   Specialty:  Hematology and Oncology Why:  you will be called for appointment  Contact information: [place] [id number]            Time spent on Discharge: 24mins   Signed:   , M.D. [facility] 02/12/2016, 10:52 AM Pager: [phone]

## 2016-02-18 ENCOUNTER — Telehealth: Payer: Self-pay | Admitting: Hematology and Oncology

## 2016-02-18 NOTE — Telephone Encounter (Signed)
Spoke with pt  to inform pt of r/s appt per [facility] due to  upcoming weather condition

## 2016-02-21 ENCOUNTER — Inpatient Hospital Stay: Payer: BLUE CROSS/BLUE SHIELD | Admitting: Hematology and Oncology

## 2016-02-23 ENCOUNTER — Ambulatory Visit: Payer: BLUE CROSS/BLUE SHIELD | Admitting: Internal Medicine

## 2016-02-25 ENCOUNTER — Ambulatory Visit (HOSPITAL_BASED_OUTPATIENT_CLINIC_OR_DEPARTMENT_OTHER): Payer: BLUE CROSS/BLUE SHIELD | Admitting: Hematology and Oncology

## 2016-02-25 ENCOUNTER — Ambulatory Visit: Payer: BLUE CROSS/BLUE SHIELD

## 2016-02-25 VITALS — BP 134/95 | HR 67 | Temp 98.3°F | Resp 18 | Ht 67.0 in | Wt 269.9 lb

## 2016-02-25 DIAGNOSIS — E669 Obesity, unspecified: Secondary | ICD-10-CM | POA: Diagnosis not present

## 2016-02-25 DIAGNOSIS — Z862 Personal history of diseases of the blood and blood-forming organs and certain disorders involving the immune mechanism: Secondary | ICD-10-CM | POA: Diagnosis not present

## 2016-02-25 DIAGNOSIS — I2699 Other pulmonary embolism without acute cor pulmonale: Secondary | ICD-10-CM

## 2016-02-28 ENCOUNTER — Encounter: Payer: Self-pay | Admitting: Internal Medicine

## 2016-02-28 ENCOUNTER — Ambulatory Visit (INDEPENDENT_AMBULATORY_CARE_PROVIDER_SITE_OTHER): Payer: BLUE CROSS/BLUE SHIELD | Admitting: Internal Medicine

## 2016-02-28 ENCOUNTER — Encounter: Payer: Self-pay | Admitting: Hematology and Oncology

## 2016-02-28 VITALS — BP 120/70 | HR 72 | Temp 98.6°F | Resp 20 | Ht 67.0 in | Wt 271.4 lb

## 2016-02-28 DIAGNOSIS — Z720 Tobacco use: Secondary | ICD-10-CM

## 2016-02-28 DIAGNOSIS — I10 Essential (primary) hypertension: Secondary | ICD-10-CM | POA: Diagnosis not present

## 2016-02-28 DIAGNOSIS — I2699 Other pulmonary embolism without acute cor pulmonale: Secondary | ICD-10-CM | POA: Diagnosis not present

## 2016-02-28 DIAGNOSIS — E669 Obesity, unspecified: Secondary | ICD-10-CM | POA: Diagnosis not present

## 2016-02-28 LAB — CARDIOLIPIN ANTIBODIES, IGG, IGM, IGA: Anticardiolipin Ab,IgA,Qn: <9 APL U/mL (ref 0–11)

## 2016-02-28 LAB — FACTOR 8 ASSAY: FACTOR VIII ACTIVITY: 156 % (ref 57–163)

## 2016-02-28 LAB — BETA-2-GLYCOPROTEIN I ABS, IGG/M/A: Beta-2 Glyco 1 IgM: <9 GPI IgM units (ref 0–32)

## 2016-02-28 LAB — PROTEIN C, TOTAL: Protein C Antigen: 89 % (ref 60–150)

## 2016-02-28 LAB — ACTIVATED PROTEIN C RESISTANCE: Act.Prt.C Resist.: 2.9 ratio (ref 2.0–3.5)

## 2016-02-28 MED ORDER — VARENICLINE TARTRATE 1 MG PO TABS: 1.0000 mg | ORAL_TABLET | Freq: Two times a day (BID) | ORAL | 3 refills | Status: DC

## 2016-02-28 MED ORDER — HYDROCHLOROTHIAZIDE 25 MG PO TABS: 25.0000 mg | ORAL_TABLET | Freq: Every day | ORAL | 5 refills | Status: DC

## 2016-02-28 MED ORDER — AMLODIPINE BESYLATE 5 MG PO TABS: 5.0000 mg | ORAL_TABLET | Freq: Every day | ORAL | 5 refills | Status: DC

## 2016-02-28 NOTE — Assessment & Plan Note (Addendum)
Acute bilateral pulmonary emboli: Diagnosed during hospitalization 02/09/2016 Current treatment: Xarelto started 02/12/2016  Xarelto toxicities: Patient denies any bruising or bleeding symptoms. It appears to be tolerating it well.  Risk factors: 1. Obesity 2. Extensive family history (mother and maternal uncle had unprovoked VTE and patient's sister and brother had VTE-unclear if they were provoked or unprovoked)  Plan: Will perform hypercoagulable workup This will also determine the duration of anticoagulation. Since the DVT is considered to be unprovoked, she may need long-term anticoagulation. Duration of therapy will be determined by her risk factors as well as her tolerability to treatment.  Return to clinic in 2 weeks to discuss the lab results.

## 2016-02-28 NOTE — Patient Instructions (Signed)
Limit your sodium (Salt) intake  Please check your blood pressure on a regular basis.  If it is consistently greater than 150/90, please make an office appointment.  Smoking tobacco is very bad for your health. You should stop smoking immediately.  Hematology follow-up as scheduled  Return here in 3 months for follow-up  You need to lose weight.  Consider a lower calorie diet and regular exercise.

## 2016-02-28 NOTE — Progress Notes (Signed)
[facility] CONSULT NOTE  Patient Care Team: Marletta Lor, MD as PCP - General  CHIEF COMPLAINTS/PURPOSE OF CONSULTATION:  Acute bilateral pulmonary emboli  HISTORY OF PRESENTING ILLNESS:  Patricia [name] 50 y.o. female is here because of recent diagnosis of acute bilateral pulmonary emboli when she presented to the hospital on 02/09/2016 with shortness of breath exertion and a syncope at work. CT of the chest revealed bilateral pulmonary emboli with mild right heart strain. She was not felt to need thrombolyze his. She was initially treated with IV heparin and then switched to Xarelto. She has extensive family history of blood clots but does not have any known inherited hypercoagulable risk factors. She appears to be tolerating Xarelto fairly well. She denies any bruising or bleeding. Her breathing appears to have improved.  I reviewed her records extensively and collaborated the history with the patient.  MEDICAL HISTORY:  Past Medical History:  Diagnosis Date  . Hypertension     SURGICAL HISTORY: Past Surgical History:  Procedure Laterality Date  . ABDOMINAL HYSTERECTOMY    . BREAST REDUCTION SURGERY      SOCIAL HISTORY: Social History   Social History  . Marital status: Married    Spouse name: N/A  . Number of children: N/A  . Years of education: N/A   Occupational History  . Not on file.   Social History Main Topics  . Smoking status: Current Some Day Smoker    Packs/day: 0.30    Types: Cigarettes  . Smokeless tobacco: Never Used     Comment: using electronic cigarettes too  . Alcohol use Yes     Comment: occasional  . Drug use: No  . Sexual activity: Not on file   Other Topics Concern  . Not on file   Social History Narrative  . No narrative on file    FAMILY HISTORY: Family History  Problem Relation Age of Onset  . Deep vein thrombosis Other   . Clotting disorder Mother   . Clotting disorder Sister   . Clotting disorder  Brother   . Clotting disorder Maternal Uncle     ALLERGIES:  has No Known Allergies.  MEDICATIONS:  Current Outpatient Prescriptions  Medication Sig Dispense Refill  . amLODipine (NORVASC) 5 MG tablet Take 1 tablet (5 mg total) by mouth daily. 30 tablet 2  . CHANTIX CONTINUING MONTH PAK 1 MG tablet TAKE 1 TABLET BY MOUTH TWICE DAILY 56 tablet 0  . hydrochlorothiazide (HYDRODIURIL) 25 MG tablet Take 1 tablet (25 mg total) by mouth daily. 30 tablet 0  . nicotine (NICODERM CQ - DOSED IN MG/24 HOURS) 21 mg/24hr patch Place 1 patch (21 mg total) onto the skin daily. 28 patch 1  . ondansetron (ZOFRAN-ODT) 4 MG disintegrating tablet Take 4 mg by mouth every 4 (four) hours as needed for nausea/vomiting.  0  . [START ON 03/03/2016] rivaroxaban (XARELTO) 20 MG TABS tablet Take 1 tablet (20 mg total) by mouth daily with supper. Start on 03/03/16 30 tablet 6  . Rivaroxaban 15 & 20 MG TBPK Take as directed on package: Start with one 15mg  tablet by mouth twice a day with food. On Day 22 (03/03/16), switch to one 20mg  tablet once a day with food. 51 each 0  . SUMAtriptan (IMITREX) 25 MG tablet Please take one tablet at the onset of a migraine. May repeat once in 2 hours if headache persists or recurs. 10 tablet 0   No current facility-administered medications for this visit.  REVIEW OF SYSTEMS:   Constitutional: Denies fevers, chills or abnormal night sweats Eyes: Denies blurriness of vision, double vision or watery eyes Ears, nose, mouth, throat, and face: Denies mucositis or sore throat Respiratory: Shortness of breath has improved Cardiovascular: Denies palpitation, chest discomfort or lower extremity swelling Gastrointestinal:  Denies nausea, heartburn or change in bowel habits Skin: Denies abnormal skin rashes Lymphatics: Denies new lymphadenopathy or easy bruising Neurological:Denies numbness, tingling or new weaknesses Behavioral/Psych: Mood is stable, no new changes   All other systems were  reviewed with the patient and are negative.  PHYSICAL EXAMINATION: ECOG PERFORMANCE STATUS: 0 - Asymptomatic  Vitals:   02/25/16 1143  BP: (!) 134/95  Pulse: 67  Resp: 18  Temp: 98.3 F (36.8 C)   Filed Weights   02/25/16 1143  Weight: 269 lb 14.4 oz (122.4 kg)    GENERAL:alert, no distress and comfortable SKIN: skin color, texture, turgor are normal, no rashes or significant lesions EYES: normal, conjunctiva are pink and non-injected, sclera clear OROPHARYNX:no exudate, no erythema and lips, buccal mucosa, and tongue normal  NECK: supple, thyroid normal size, non-tender, without nodularity LYMPH:  no palpable lymphadenopathy in the cervical, axillary or inguinal LUNGS: clear to auscultation and percussion with normal breathing effort HEART: regular rate & rhythm and no murmurs and no lower extremity edema ABDOMEN:abdomen soft, non-tender and normal bowel sounds Musculoskeletal:no cyanosis of digits and no clubbing  PSYCH: alert & oriented x 3 with fluent speech NEURO: no focal motor/sensory deficits LABORATORY DATA:  I have reviewed the data as listed Lab Results  Component Value Date   WBC 8.1 02/12/2016   HGB 12.1 02/12/2016   HCT 38.2 02/12/2016   MCV 92.5 02/12/2016   PLT 246 02/12/2016   Lab Results  Component Value Date   NA 140 02/10/2016   K 4.2 02/10/2016   CL 107 02/10/2016   CO2 29 02/10/2016    RADIOGRAPHIC STUDIES: I have personally reviewed the radiological reports and agreed with the findings in the report.  ASSESSMENT AND PLAN:  Pulmonary embolism without acute cor pulmonale (HCC) Acute bilateral pulmonary emboli: Diagnosed during hospitalization 02/09/2016 Current treatment: Xarelto started 02/12/2016  Xarelto toxicities: Patient denies any bruising or bleeding symptoms. It appears to be tolerating it well.  Risk factors: 1. Obesity 2. Extensive family history (mother and maternal uncle had unprovoked VTE and patient's sister and brother had  VTE-unclear if they were provoked or unprovoked)  Plan: Will perform hypercoagulable workup This will also determine the duration of anticoagulation. Since the DVT is considered to be unprovoked, she may need long-term anticoagulation. Duration of therapy will be determined by her risk factors as well as her tolerability to treatment.  Return to clinic in 2 weeks to discuss the lab results.   All questions were answered. The patient knows to call the clinic with any problems, questions or concerns.    Rulon Eisenmenger, MD 02/28/16

## 2016-02-28 NOTE — Progress Notes (Signed)
Pre visit review using our clinic review tool, if applicable. No additional management support is needed unless otherwise documented below in the visit note. 

## 2016-02-28 NOTE — Progress Notes (Signed)
Subjective:    Patient ID: Patricia [name], female    DOB: [birth year], 50 y.o.   MRN: [id number]  HPI Admit date: 02/09/2016 Discharge date: 02/12/2016  Discharge Diagnoses:    . Pulmonary embolism ([place]) . HEADACHE, TENSION . Tobacco abuse . Hypertension . Hypokalemia . severe Pulmonary HTN  50 year old patient who was discharged from the hospital.  16 days ago following bilateral submassive pulmonary emboli.  She was treated with IV heparin followed by oral anticoagulation.  She presented with syncope.  No provocative factors.  She has been seen by hematology and hypercoagulability workup is in progress.  She is scheduled for follow-up in about 2 weeks.  She is completing 3 weeks of  Xarelto 15 mg twice daily and will initiate 20 mg daily.  She has done well and denies any significant exercise limitations.  She is anxious to return to work.  Her work is sedentary. She continues to smoke but has cutback considerably.  She is accompanied by her husband today who is also agreeable for a smoking cessation program.  Hospital records reviewed  Hematology notes reviewed   Past Medical History:  Diagnosis Date  . Hypertension      Social History   Social History  . Marital status: Married    Spouse name: N/A  . Number of children: N/A  . Years of education: N/A   Occupational History  . Not on file.   Social History Main Topics  . Smoking status: Current Some Day Smoker    Packs/day: 0.30    Types: Cigarettes  . Smokeless tobacco: Never Used     Comment: using electronic cigarettes too  . Alcohol use Yes     Comment: occasional  . Drug use: No  . Sexual activity: Not on file   Other Topics Concern  . Not on file   Social History Narrative  . No narrative on file    Past Surgical History:  Procedure Laterality Date  . ABDOMINAL HYSTERECTOMY    . BREAST REDUCTION SURGERY      Family History  Problem Relation Age of Onset  . Deep vein thrombosis Other   .  Clotting disorder Mother   . Clotting disorder Sister   . Clotting disorder Brother   . Clotting disorder Maternal Uncle     No Known Allergies  Current Outpatient Prescriptions on File Prior to Visit  Medication Sig Dispense Refill  . ondansetron (ZOFRAN-ODT) 4 MG disintegrating tablet Take 4 mg by mouth every 4 (four) hours as needed for nausea/vomiting.  0  . [START ON 03/03/2016] rivaroxaban (XARELTO) 20 MG TABS tablet Take 1 tablet (20 mg total) by mouth daily with supper. Start on 03/03/16 30 tablet 6  . Rivaroxaban 15 & 20 MG TBPK Take as directed on package: Start with one 15mg  tablet by mouth twice a day with food. On Day 22 (03/03/16), switch to one 20mg  tablet once a day with food. 51 each 0  . SUMAtriptan (IMITREX) 25 MG tablet Please take one tablet at the onset of a migraine. May repeat once in 2 hours if headache persists or recurs. 10 tablet 0   No current facility-administered medications on file prior to visit.     BP 120/70 (BP Location: Left Arm, Patient Position: Sitting, Cuff Size: Large)   Pulse 72   Temp 98.6 F (37 C) (Oral)   Resp 20   Ht 5\' 7"  (1.702 m)   Wt 271 lb 6.1 oz (123.1 kg)  SpO2 98%   BMI 42.50 kg/m       Review of Systems  Constitutional: Negative.   HENT: Negative for congestion, dental problem, hearing loss, rhinorrhea, sinus pressure, sore throat and tinnitus.   Eyes: Negative for pain, discharge and visual disturbance.  Respiratory: Negative for cough and shortness of breath.   Cardiovascular: Negative for chest pain, palpitations and leg swelling.  Gastrointestinal: Negative for abdominal distention, abdominal pain, blood in stool, constipation, diarrhea, nausea and vomiting.  Genitourinary: Negative for difficulty urinating, dysuria, flank pain, frequency, hematuria, pelvic pain, urgency, vaginal bleeding, vaginal discharge and vaginal pain.  Musculoskeletal: Negative for arthralgias, gait problem and joint swelling.  Skin: Negative  for rash.  Neurological: Positive for syncope. Negative for dizziness, speech difficulty, weakness, numbness and headaches.  Hematological: Negative for adenopathy.  Psychiatric/Behavioral: Negative for agitation, behavioral problems and dysphoric mood. The patient is not nervous/anxious.        Objective:   Physical Exam  Constitutional: She is oriented to person, place, and time. She appears well-developed and well-nourished. No distress.  Blood pressure well controlled No tachycardia O2 saturation 98%  HENT:  Head: Normocephalic.  Right Ear: External ear normal.  Left Ear: External ear normal.  Mouth/Throat: Oropharynx is clear and moist.  Eyes: Conjunctivae and EOM are normal. Pupils are equal, round, and reactive to light.  Neck: Normal range of motion. Neck supple. No thyromegaly present.  Cardiovascular: Normal rate, regular rhythm, normal heart sounds and intact distal pulses.   Pulmonary/Chest: Effort normal and breath sounds normal. No respiratory distress. She has no wheezes. She has no rales.  Abdominal: Soft. Bowel sounds are normal. She exhibits no mass. There is no tenderness.  Musculoskeletal: Normal range of motion. She exhibits no edema or tenderness.  Lymphadenopathy:    She has no cervical adenopathy.  Neurological: She is alert and oriented to person, place, and time.  Skin: Skin is warm and dry. No rash noted.  Psychiatric: She has a normal mood and affect. Her behavior is normal.          Assessment & Plan:  Status post bilateral pulmonary emboli.  We'll continue anticoagulation for a minimum of 6 months.  Consider lifelong anticoagulation depending on hypercoagulability profile History tobacco use.  Total smoking cessation encouraged Obesity Essential hypertension, stable  Follow-up hematology Recheck here 3 months  , Pilar Plate

## 2016-02-29 LAB — LUPUS ANTICOAGULANT PANEL
HEXAGONAL PHASE PHOSPHOLIPID: 11 s (ref 0–11)
PTT-LA MIX: 50.9 s — AB (ref 0.0–48.9)
PTT-LA: 53.6 s — ABNORMAL HIGH (ref 0.0–51.9)
dRVVT Confirm: 2.2 ratio — ABNORMAL HIGH (ref 0.8–1.2)
dRVVT Mix: 130.6 s — ABNORMAL HIGH (ref 0.0–47.0)

## 2016-02-29 LAB — PROTEIN S, TOTAL: Protein S, Total: 107 % (ref 60–150)

## 2016-02-29 LAB — PROTEIN C ACTIVITY: Protein C-Functional: 125 % (ref 73–180)

## 2016-02-29 LAB — ANTITHROMBIN III: ANTITHROMBIN ACTIVITY: 186 % — AB (ref 75–135)

## 2016-02-29 LAB — PROTEIN S ACTIVITY: Protein S-Functional: 143 % — ABNORMAL HIGH (ref 63–140)

## 2016-03-01 LAB — FACTOR 5 LEIDEN

## 2016-03-02 LAB — MTHFR DNA ANALYSIS

## 2016-03-03 LAB — PROTHROMBIN GENE MUTATION

## 2016-03-10 ENCOUNTER — Encounter: Payer: Self-pay | Admitting: Hematology and Oncology

## 2016-03-10 ENCOUNTER — Ambulatory Visit (HOSPITAL_BASED_OUTPATIENT_CLINIC_OR_DEPARTMENT_OTHER): Payer: BLUE CROSS/BLUE SHIELD | Admitting: Hematology and Oncology

## 2016-03-10 DIAGNOSIS — I2699 Other pulmonary embolism without acute cor pulmonale: Secondary | ICD-10-CM

## 2016-03-10 NOTE — Assessment & Plan Note (Signed)
Acute bilateral pulmonary emboli: Diagnosed during hospitalization 02/09/2016 Current treatment: Xarelto started 02/12/2016  Xarelto toxicities: Patient denies any bruising or bleeding symptoms. It appears to be tolerating it well.  Risk factors: 1. Obesity 2. Extensive family history (mother and maternal uncle had unprovoked VTE and patient's sister and brother had VTE-unclear if they were provoked or unprovoked)  Hypercoagulable workup: 1. MTHFR gene mutation: Single mutation [id number] identified (unlikely to be a significant risk factor) 2. factor VIII/I/MCMLVI percent 3. No evidence of activated protein C resistance 4. Anticardiolipin antibodies negative 5. Beta-2 glycoprotein 1 antibodies negative  6. Anti-thrombin 186% 7. Prothrombin gene mutation negative 8. Factor V Leiden negative  9.Lupus anticoagulant panel: Positive  11. Protein C and protein S normal  I discussed the result of the lupus anticoagulant test and its significance. Only repeat positive test indicates that presence of this antibody. I would like to repeat this test again in 3 months and follow-up afterwards. If she is persistently positive and she may need to remain on anticoagulation indefinitely.   Return to clinic in 3 months with labs done ahead of time and follow-up

## 2016-03-10 NOTE — Progress Notes (Signed)
Patient Care Team: Marletta Lor, MD as PCP - General  DIAGNOSIS: Pulmonary emboli  CHIEF COMPLIANT: Follow-up to review blood work  INTERVAL HISTORY: Patricia [name] is a 50 year old with above-mentioned history of pulmonary emboli currently on anticoagulation with Xarelto. She is here to review the results of the blood work that was done couple of weeks ago.  REVIEW OF SYSTEMS:   Constitutional: Denies fevers, chills or abnormal weight loss Eyes: Denies blurriness of vision Ears, nose, mouth, throat, and face: Denies mucositis or sore throat Respiratory: Denies cough, dyspnea or wheezes Cardiovascular: Denies palpitation, chest discomfort Gastrointestinal:  Denies nausea, heartburn or change in bowel habits Skin: Denies abnormal skin rashes Lymphatics: Denies new lymphadenopathy or easy bruising Neurological:Denies numbness, tingling or new weaknesses Behavioral/Psych: Mood is stable, no new changes  Extremities: No lower extremity edema  All other systems were reviewed with the patient and are negative.  I have reviewed the past medical history, past surgical history, social history and family history with the patient and they are unchanged from previous note.  ALLERGIES:  has No Known Allergies.  MEDICATIONS:  Current Outpatient Prescriptions  Medication Sig Dispense Refill  . amLODipine (NORVASC) 5 MG tablet Take 1 tablet (5 mg total) by mouth daily. 30 tablet 5  . hydrochlorothiazide (HYDRODIURIL) 25 MG tablet Take 1 tablet (25 mg total) by mouth daily. 30 tablet 5  . ondansetron (ZOFRAN-ODT) 4 MG disintegrating tablet Take 4 mg by mouth every 4 (four) hours as needed for nausea/vomiting.  0  . rivaroxaban (XARELTO) 20 MG TABS tablet Take 1 tablet (20 mg total) by mouth daily with supper. Start on 03/03/16 30 tablet 6  . Rivaroxaban 15 & 20 MG TBPK Take as directed on package: Start with one 15mg  tablet by mouth twice a day with food. On Day 22 (03/03/16), switch  to one 20mg  tablet once a day with food. 51 each 0  . SUMAtriptan (IMITREX) 25 MG tablet Please take one tablet at the onset of a migraine. May repeat once in 2 hours if headache persists or recurs. 10 tablet 0  . varenicline (CHANTIX CONTINUING MONTH PAK) 1 MG tablet Take 1 tablet (1 mg total) by mouth 2 (two) times daily. 56 tablet 3   No current facility-administered medications for this visit.     PHYSICAL EXAMINATION: ECOG PERFORMANCE STATUS: 1 - Symptomatic but completely ambulatory  Vitals:   03/10/16 1201  BP: (!) 144/70  Pulse: 80  Resp: 18  Temp: 98.4 F (36.9 C)   Filed Weights   03/10/16 1201  Weight: 270 lb (122.5 kg)    GENERAL:alert, no distress and comfortable SKIN: skin color, texture, turgor are normal, no rashes or significant lesions EYES: normal, Conjunctiva are pink and non-injected, sclera clear OROPHARYNX:no exudate, no erythema and lips, buccal mucosa, and tongue normal  NECK: supple, thyroid normal size, non-tender, without nodularity LYMPH:  no palpable lymphadenopathy in the cervical, axillary or inguinal LUNGS: clear to auscultation and percussion with normal breathing effort HEART: regular rate & rhythm and no murmurs and no lower extremity edema ABDOMEN:abdomen soft, non-tender and normal bowel sounds MUSCULOSKELETAL:no cyanosis of digits and no clubbing  NEURO: alert & oriented x 3 with fluent speech, no focal motor/sensory deficits EXTREMITIES: No lower extremity edema  LABORATORY DATA:  I have reviewed the data as listed   Chemistry      Component Value Date/Time   NA 140 02/10/2016 0153   K 4.2 02/10/2016 0153   CL 107 02/10/2016  0153   CO2 29 02/10/2016 0153   BUN 14 02/10/2016 0153   CREATININE 1.12 (H) 02/10/2016 0153      Component Value Date/Time   CALCIUM 9.0 02/10/2016 0153   ALKPHOS 80 02/10/2016 0153   AST 46 (H) 02/10/2016 0153   ALT 64 (H) 02/10/2016 0153   BILITOT 0.3 02/10/2016 0153       Lab Results  Component  Value Date   WBC 8.1 02/12/2016   HGB 12.1 02/12/2016   HCT 38.2 02/12/2016   MCV 92.5 02/12/2016   PLT 246 02/12/2016   NEUTROABS 3.8 01/01/2015     ASSESSMENT & PLAN:  Pulmonary embolism without acute cor pulmonale (HCC) Acute bilateral pulmonary emboli: Diagnosed during hospitalization 02/09/2016 Current treatment: Xarelto started 02/12/2016  Xarelto toxicities: Patient denies any bruising or bleeding symptoms. It appears to be tolerating it well.  Risk factors: 1. Obesity 2. Extensive family history (mother and maternal uncle had unprovoked VTE and patient's sister and brother had VTE-unclear if they were provoked or unprovoked)  Hypercoagulable workup: 1. MTHFR gene mutation: Single mutation [id number] identified (unlikely to be a significant risk factor) 2. factor VIII/I/MCMLVI percent 3. No evidence of activated protein C resistance 4. Anticardiolipin antibodies negative 5. Beta-2 glycoprotein 1 antibodies negative  6. Anti-thrombin 186% 7. Prothrombin gene mutation negative 8. Factor V Leiden negative  9.Lupus anticoagulant panel: Positive  11. Protein C and protein S normal  I discussed the result of the lupus anticoagulant test and its significance. Only repeat positive test indicates that presence of this antibody. I would like to repeat this test again in 3 months and follow-up afterwards. If she is persistently positive and she may need to remain on anticoagulation indefinitely.   Return to clinic in 3 months with labs done ahead of time and follow-up     Orders Placed This Encounter  Procedures  . Lupus anticoagulant panel    Standing Status:   Future    Standing Expiration Date:   04/14/2017   The patient has a good understanding of the overall plan. she agrees with it. she will call with any problems that may develop before the next visit here.   Rulon Eisenmenger, MD 03/10/16

## 2016-06-09 ENCOUNTER — Other Ambulatory Visit: Payer: BLUE CROSS/BLUE SHIELD

## 2016-06-09 DIAGNOSIS — I2699 Other pulmonary embolism without acute cor pulmonale: Secondary | ICD-10-CM

## 2016-06-13 LAB — LUPUS ANTICOAGULANT PANEL
PTT-LA: 33.3 s (ref 0.0–51.9)
dRVVT Mix: 43 s (ref 0.0–47.0)
dRVVT: 48.9 s — ABNORMAL HIGH (ref 0.0–47.0)

## 2016-06-15 NOTE — Assessment & Plan Note (Deleted)
Acute bilateral pulmonary emboli: Diagnosed during hospitalization 02/09/2016 Current treatment: Xarelto started 02/12/2016  Xarelto toxicities: Patient denies any bruising or bleeding symptoms. It appears to be tolerating it well.  Risk factors: 1. Obesity 2. Extensive family history (mother and maternal uncle had unprovoked VTE and patient's sister and brother had VTE-unclear if they were provoked or unprovoked)  Hypercoagulable workup: 1. MTHFR gene mutation: Single mutation [id number] identified (unlikely to be a significant risk factor) 2. factor VIII/I/MCMLVI percent 3. No evidence of activated protein C resistance 4. Anticardiolipin antibodies negative 5. Beta-2 glycoprotein 1 antibodies negative  6. Anti-thrombin 186% 7. Prothrombin gene mutation negative 8. Factor V Leiden negative  9.Lupus anticoagulant panel: Positive  11. Protein C and protein S normal  Repeat lupus anticoagulant testing was negative on 06/09/2016 Plan: Continue Xarelto for 3 more months and recheck lupus anticoagulant testing. If the subsequent testing is negative then we can stop treatment.

## 2016-06-16 ENCOUNTER — Ambulatory Visit: Payer: BLUE CROSS/BLUE SHIELD | Admitting: Hematology and Oncology

## 2016-10-30 ENCOUNTER — Encounter: Payer: Self-pay | Admitting: Family Medicine

## 2016-10-30 ENCOUNTER — Telehealth: Payer: Self-pay | Admitting: *Deleted

## 2016-10-30 ENCOUNTER — Ambulatory Visit (INDEPENDENT_AMBULATORY_CARE_PROVIDER_SITE_OTHER): Payer: BLUE CROSS/BLUE SHIELD | Admitting: Family Medicine

## 2016-10-30 VITALS — BP 146/92 | HR 96 | Temp 99.0°F | Wt 270.6 lb

## 2016-10-30 DIAGNOSIS — W57XXXA Bitten or stung by nonvenomous insect and other nonvenomous arthropods, initial encounter: Secondary | ICD-10-CM | POA: Diagnosis not present

## 2016-10-30 DIAGNOSIS — I10 Essential (primary) hypertension: Secondary | ICD-10-CM | POA: Diagnosis not present

## 2016-10-30 DIAGNOSIS — R05 Cough: Secondary | ICD-10-CM | POA: Diagnosis not present

## 2016-10-30 DIAGNOSIS — J069 Acute upper respiratory infection, unspecified: Secondary | ICD-10-CM

## 2016-10-30 DIAGNOSIS — R059 Cough, unspecified: Secondary | ICD-10-CM

## 2016-10-30 MED ORDER — FLUTICASONE PROPIONATE 50 MCG/ACT NA SUSP: 2.0000 | Freq: Every day | NASAL | 6 refills | Status: DC

## 2016-10-30 MED ORDER — BENZONATATE 100 MG PO CAPS: 100.0000 mg | ORAL_CAPSULE | Freq: Three times a day (TID) | ORAL | 0 refills | Status: DC

## 2016-10-30 MED ORDER — DOXYCYCLINE HYCLATE 100 MG PO TABS: ORAL_TABLET | ORAL | 0 refills | Status: DC

## 2016-10-30 NOTE — Telephone Encounter (Signed)
"  Dr. Lindi Adie put me on Xarelro.  I took my last xarelto on Friday.  The pharmacy will not fulfill refills.  A new authorization is needed and [employer] won't honor Xarelto.  Can I go back on Warfarin?  I am open today to come in if Dr. Lindi Adie needs to see me [phone]).  I came in December for lab appointment."    Patient was seen last on 03-10-2016.  Missed January 2018 visit.  Called [facility] who report "Dr. Tana Coast is the only provider on file for the Xarelto order.  Patient received refill last month despite order expiration.  Need an order before they can bill insurance to know if insurance will cover the xarelto."  Will notify Dr. Lindi Adie of patient request.

## 2016-10-30 NOTE — Progress Notes (Signed)
Subjective:    Patient ID: Patricia [name], female    DOB: 10/22/1965, 51 y.o.   MRN: [id number]  HPI  Patricia [name] is a 51 year old female who presents today with nasal congestion, rhinitis, scratchy throat, post nasal drip, and cough that is minimally productive of yellow sputum that started 3 days ago. Associated symptom of low grade fever of 99.2 present with chills. Influenza vaccine is UTD. She has a history of smoking and is in the process of quitting; she reports no smoking for the past 3 weeks. Treatment at home includes robitussin DM which has provided moderate benefit.  She also reports a tick bite on her right hip that she removed one day ago. She reports one week prior to this episode she had completed a street cleaning where she suspected that this occurred however she is unsure of how long the tick was attached as she also has a dog in her home. She removed the tick and stated that it was slightly engorged. She denies N/V/D, myalgias, headaches, or rash.   Blood pressure is elevated today. She states that she is not taking HCTZ and amlodipine at this time. She also reports that she has not follow up for evaluation and management of blood pressure as recommended by PCP. Today, she denies chest pain, palpitations, SOB, numbness, tingling, weakness, headaches, or edema.  She has a history of bilateral submassive pulmonary emboli that has been evaluated by hematology. She has been treated with Xarelto. She was continued on anticoagulation for 6 months. She is calling for an appointment today for follow up for this issue as consideration of lifelong anticoagulation may be considered depending upon her hypercoagulability profile.   Review of Systems  Constitutional: Positive for chills and fever. Negative for fatigue.  HENT: Positive for congestion, postnasal drip and rhinorrhea. Negative for sinus pain, sinus pressure and sneezing.   Respiratory: Positive for cough. Negative for  shortness of breath and wheezing.   Cardiovascular: Negative for chest pain and palpitations.  Gastrointestinal: Negative for abdominal pain, constipation, diarrhea, nausea and vomiting.  Genitourinary: Negative for dysuria and hematuria.  Musculoskeletal: Negative for arthralgias and myalgias.  Neurological: Negative for dizziness, weakness, light-headedness, numbness and headaches.  Hematological: Does not bruise/bleed easily.   Past Medical History:  Diagnosis Date  . Hypertension      Social History   Social History  . Marital status: Married    Spouse name: N/A  . Number of children: N/A  . Years of education: N/A   Occupational History  . Not on file.   Social History Main Topics  . Smoking status: Current Some Day Smoker    Packs/day: 0.30    Types: Cigarettes  . Smokeless tobacco: Never Used     Comment: using electronic cigarettes too  . Alcohol use Yes     Comment: occasional  . Drug use: No  . Sexual activity: Not on file   Other Topics Concern  . Not on file   Social History Narrative  . No narrative on file    Past Surgical History:  Procedure Laterality Date  . ABDOMINAL HYSTERECTOMY    . BREAST REDUCTION SURGERY      Family History  Problem Relation Age of Onset  . Deep vein thrombosis Other   . Clotting disorder Mother   . Clotting disorder Sister   . Clotting disorder Brother   . Clotting disorder Maternal Uncle     No Known Allergies  Current Outpatient Prescriptions  on File Prior to Visit  Medication Sig Dispense Refill  . amLODipine (NORVASC) 5 MG tablet Take 1 tablet (5 mg total) by mouth daily. 30 tablet 5  . hydrochlorothiazide (HYDRODIURIL) 25 MG tablet Take 1 tablet (25 mg total) by mouth daily. 30 tablet 5  . ondansetron (ZOFRAN-ODT) 4 MG disintegrating tablet Take 4 mg by mouth every 4 (four) hours as needed for nausea/vomiting.  0  . rivaroxaban (XARELTO) 20 MG TABS tablet Take 1 tablet (20 mg total) by mouth daily with  supper. Start on 03/03/16 30 tablet 6  . Rivaroxaban 15 & 20 MG TBPK Take as directed on package: Start with one 15mg  tablet by mouth twice a day with food. On Day 22 (03/03/16), switch to one 20mg  tablet once a day with food. 51 each 0  . SUMAtriptan (IMITREX) 25 MG tablet Please take one tablet at the onset of a migraine. May repeat once in 2 hours if headache persists or recurs. 10 tablet 0  . varenicline (CHANTIX CONTINUING MONTH PAK) 1 MG tablet Take 1 tablet (1 mg total) by mouth 2 (two) times daily. 56 tablet 3   No current facility-administered medications on file prior to visit.     BP (!) 146/92   Pulse 96   Temp 99 F (37.2 C) (Oral)   Wt 270 lb 9.6 oz (122.7 kg)   SpO2 97%   BMI 42.38 kg/m       Objective:   Physical Exam  Constitutional: She is oriented to person, place, and time. She appears well-developed and well-nourished.  HENT:  Right Ear: Tympanic membrane normal.  Left Ear: Tympanic membrane normal.  Nose: No rhinorrhea. Right sinus exhibits no maxillary sinus tenderness and no frontal sinus tenderness. Left sinus exhibits no maxillary sinus tenderness and no frontal sinus tenderness.  Mouth/Throat: Mucous membranes are normal. No oropharyngeal exudate or posterior oropharyngeal erythema.  Eyes: Pupils are equal, round, and reactive to light. No scleral icterus.  Neck: Neck supple.  Cardiovascular: Normal rate, regular rhythm and intact distal pulses.   Pulmonary/Chest: Effort normal and breath sounds normal. She has no wheezes. She has no rales.  Abdominal: Soft. Bowel sounds are normal. She exhibits no distension. There is no tenderness.  Lymphadenopathy:    She has no cervical adenopathy.  Neurological: She is alert and oriented to person, place, and time.  Skin: Skin is warm and dry. No rash noted.  Pinpoint area of eschar where tick was removed on right hip. No erythema, edema, or rash present  Psychiatric: She has a normal mood and affect. Her behavior is  normal. Judgment and thought content normal.       Assessment & Plan:  1. Acute upper respiratory infection Symptoms are likely viral in nature. Advised patient on supportive measures:  Get rest, drink plenty of fluids, and use tylenol as needed for pain. Follow up if fever >101, if symptoms worsen or if symptoms are not improved in 3 to 4  days. Patient verbalizes understanding.   - fluticasone (FLONASE) 50 MCG/ACT nasal spray; Place 2 sprays into both nostrils daily.  Dispense: 16 g; Refill: 6  2. Essential hypertension Recheck of BP noted a decrease however patient remains above 140/90. Advised the importance of taking medication as prescribed by PCP. Further advised monitoring of BP, document readings, and follow up with PCP in 2 to 4 weeks for evaluation of chronic conditions. We discussed sooner evaluation if BP is consistently >150/90.  3. Tick bite, initial encounter No  concerning symptoms noted today; no rash, area healing; will treat with single dose doxycycline as length of time tick may have been attached could have exceeded 36 hours and she reported engorged tick.  4. Cough  - benzonatate (TESSALON) 100 MG capsule; Take 1 capsule (100 mg total) by mouth 3 (three) times daily.  Dispense: 20 capsule; Refill: 0  Advised follow up with PCP in 2 to 4 weeks or sooner if needed as she is behind on routine follow up care. Patient voiced understanding and agreed with plan.  Delano Metz, FNP-C

## 2016-10-30 NOTE — Patient Instructions (Addendum)
Your symptoms today are most likely caused by viral illness. Please drink plenty of water enough for your urine to be pale yellow or clear. You may use Tylenol 325 mg every 6 hours as needed. Mucinex or benzonatate can be used for cough. Follow-up for evaluation if your symptoms do not improve in 3-4 days, worsen, or you develop a fever greater than 100.  Upper Respiratory Infection, Adult Most upper respiratory infections (URIs) are caused by a virus. A URI affects the nose, throat, and upper air passages. The most common type of URI is often called "the common cold." Follow these instructions at home:  Take medicines only as told by your doctor.  Gargle warm saltwater or take cough drops to comfort your throat as told by your doctor.  Use a warm mist humidifier or inhale steam from a shower to increase air moisture. This may make it easier to breathe.  Drink enough fluid to keep your pee (urine) clear or pale yellow.  Eat soups and other clear broths.  Have a healthy diet.  Rest as needed.  Go back to work when your fever is gone or your doctor says it is okay.  You may need to stay home longer to avoid giving your URI to others.  You can also wear a face mask and wash your hands often to prevent spread of the virus.  Use your inhaler more if you have asthma.  Do not use any tobacco products, including cigarettes, chewing tobacco, or electronic cigarettes. If you need help quitting, ask your doctor. Contact a doctor if:  You are getting worse, not better.  Your symptoms are not helped by medicine.  You have chills.  You are getting more short of breath.  You have brown or red mucus.  You have yellow or brown discharge from your nose.  You have pain in your face, especially when you bend forward.  You have a fever.  You have puffy (swollen) neck glands.  You have pain while swallowing.  You have white areas in the back of your throat. Get help right away  if:  You have very bad or constant:  Headache.  Ear pain.  Pain in your forehead, behind your eyes, and over your cheekbones (sinus pain).  Chest pain.  You have long-lasting (chronic) lung disease and any of the following:  Wheezing.  Long-lasting cough.  Coughing up blood.  A change in your usual mucus.  You have a stiff neck.  You have changes in your:  Vision.  Hearing.  Thinking.  Mood. This information is not intended to replace advice given to you by your health care provider. Make sure you discuss any questions you have with your health care provider. Document Released: 11/15/2007 Document Revised: 01/30/2016 Document Reviewed: 09/03/2013 Elsevier Interactive Patient Education  2017 [place] NOW OFFER   [name] FAST TRACK!!!  SAME DAY Appointments for ACUTE CARE  Such as: Sprains, Injuries, cuts, abrasions, rashes, muscle pain, joint pain, back pain Colds, flu, sore throats, headache, allergies, cough, fever  Ear pain, sinus and eye infections Abdominal pain, nausea, vomiting, diarrhea, upset stomach Animal/insect bites  3 Easy Ways to Schedule: Walk-In Scheduling Call in scheduling Mychart Sign-up: [email or web address]

## 2016-10-31 ENCOUNTER — Telehealth: Payer: Self-pay | Admitting: Hematology and Oncology

## 2016-10-31 ENCOUNTER — Telehealth: Payer: Self-pay

## 2016-10-31 NOTE — Telephone Encounter (Signed)
I called the patient regarding her anticoagulation question. She missed her appointment in January. The labs done in December did not show any lupus anticoagulant. This means that she could stop anticoagulation at this time. She had been on it for the past 9 months. I tried to call her but her mobile phone is not accepting any voice mail.

## 2016-10-31 NOTE — Telephone Encounter (Signed)
Called pt to let her know that she does not need to be on any anticoagulation at this time and advised that she follow up with her pcp if she has any symptoms or concerns. She may contact us any time with any other concerns as well. Pt verbalized understanding and has no further questions at this time.

## 2017-03-01 ENCOUNTER — Encounter: Payer: Self-pay | Admitting: Internal Medicine

## 2017-04-02 ENCOUNTER — Encounter: Payer: Self-pay | Admitting: Internal Medicine

## 2017-04-02 ENCOUNTER — Ambulatory Visit (INDEPENDENT_AMBULATORY_CARE_PROVIDER_SITE_OTHER): Payer: BLUE CROSS/BLUE SHIELD | Admitting: Internal Medicine

## 2017-04-02 VITALS — BP 148/80 | HR 86 | Temp 98.4°F | Ht 67.0 in | Wt 272.0 lb

## 2017-04-02 DIAGNOSIS — Z0000 Encounter for general adult medical examination without abnormal findings: Secondary | ICD-10-CM

## 2017-04-02 DIAGNOSIS — Z23 Encounter for immunization: Secondary | ICD-10-CM

## 2017-04-02 NOTE — Progress Notes (Signed)
Subjective:    Patient ID: Patricia [name], female    DOB: 1965-08-29, 51 y.o.   MRN: [id number]  HPI  HPI  51 year-old patient seen today for a health maintenance exam. She is asymptomatic. She did have a hysterectomy for benign disease in 2010  Past medical history medical problems include exogenous obesity and ongoing tobacco use.  She's had breast reduction surgery she's had gallbladder surgery in 1993 and had a hysterectomy in 2010  She was hospitalized in September 2017 for an unprovoked pulmonary embolism.  She has been evaluated by hematology and has had an extensive hypercoagulable workup.  Anticoagulation was discontinued in January 2018    Family history father age 3 has diabetes Mother age 51 history of macular degeneration 3 brothers 2 sisters- one brother died of complications of a staph infection and had a history of mental retardation  Family history is positive for thromboembolic disease  Past Medical History:  Diagnosis Date  . Hypertension      Social History   Social History  . Marital status: Married    Spouse name: N/A  . Number of children: N/A  . Years of education: N/A   Occupational History  . Not on file.   Social History Main Topics  . Smoking status: Current Some Day Smoker    Packs/day: 0.30    Types: Cigarettes  . Smokeless tobacco: Never Used     Comment: using electronic cigarettes too  . Alcohol use Yes     Comment: occasional  . Drug use: No  . Sexual activity: Not on file   Other Topics Concern  . Not on file   Social History Narrative  . No narrative on file    Past Surgical History:  Procedure Laterality Date  . ABDOMINAL HYSTERECTOMY    . BREAST REDUCTION SURGERY      Family History  Problem Relation Age of Onset  . Deep vein thrombosis Other   . Clotting disorder Mother   . Clotting disorder Sister   . Clotting disorder Brother   . Clotting disorder Maternal Uncle     No Known Allergies  No current  outpatient prescriptions on file prior to visit.   No current facility-administered medications on file prior to visit.     BP (!) 148/80 (BP Location: Left Arm, Patient Position: Sitting, Cuff Size: Normal)   Pulse 86   Temp 98.4 F (36.9 C) (Oral)   Ht 5\' 7"  (1.702 m)   Wt 272 lb (123.4 kg)   SpO2 99%   BMI 42.60 kg/m      Review of Systems  Constitutional: Negative.   HENT: Negative for congestion, dental problem, hearing loss, rhinorrhea, sinus pressure, sore throat and tinnitus.   Eyes: Negative for pain, discharge and visual disturbance.  Respiratory: Negative for cough and shortness of breath.   Cardiovascular: Negative for chest pain, palpitations and leg swelling.  Gastrointestinal: Negative for abdominal distention, abdominal pain, blood in stool, constipation, diarrhea, nausea and vomiting.  Genitourinary: Negative for difficulty urinating, dysuria, flank pain, frequency, hematuria, pelvic pain, urgency, vaginal bleeding, vaginal discharge and vaginal pain.  Musculoskeletal: Negative for arthralgias, gait problem and joint swelling.  Skin: Negative for rash.  Neurological: Negative for dizziness, syncope, speech difficulty, weakness, numbness and headaches.  Hematological: Negative for adenopathy.  Psychiatric/Behavioral: Negative for agitation, behavioral problems and dysphoric mood. The patient is not nervous/anxious.        Objective:   Physical Exam  Constitutional: She is oriented  to person, place, and time. She appears well-developed and well-nourished.  HENT:  Head: Normocephalic and atraumatic.  Right Ear: External ear normal.  Left Ear: External ear normal.  Mouth/Throat: Oropharynx is clear and moist.  Eyes: Conjunctivae and EOM are normal.  Neck: Normal range of motion. Neck supple. No JVD present. No thyromegaly present.  Cardiovascular: Normal rate, regular rhythm, normal heart sounds and intact distal pulses.   No murmur heard. Pulmonary/Chest:  Effort normal and breath sounds normal. She has no wheezes. She has no rales.  Status post breast reduction surgery  Abdominal: Soft. Bowel sounds are normal. She exhibits no distension and no mass. There is no tenderness. There is no rebound and no guarding.  Genitourinary: Vagina normal.  Musculoskeletal: Normal range of motion. She exhibits no edema or tenderness.  Neurological: She is alert and oriented to person, place, and time. She has normal reflexes. No cranial nerve deficit. She exhibits normal muscle tone. Coordination normal.  Skin: Skin is warm and dry. No rash noted.  Psychiatric: She has a normal mood and affect. Her behavior is normal.          Assessment & Plan:   Preventive health examination Exogenous obesity History of unprovoked pulmonary embolism September 2017 History of hypertension.  Presently off medications  Weight loss encouraged Check screening lab Annual mammogram Screening colonoscopy  Follow-up one year  Nyoka Cowden

## 2017-04-02 NOTE — Patient Instructions (Addendum)
Limit your sodium (Salt) intake  Please check your blood pressure on a regular basis.  If it is consistently greater than 140/90, please make an office appointment.    It is important that you exercise regularly, at least 20 minutes 3 to 4 times per week.  If you develop chest pain or shortness of breath seek  medical attention.  You need to lose weight.  Consider a lower calorie diet and regular exercise.  Return in one year for follow-up  Schedule your colonoscopy to help detect colon cancer.   Health Maintenance for Postmenopausal Women Menopause is a normal process in which your reproductive ability comes to an end. This process happens gradually over a span of months to years, usually between the ages of 30 and 24. Menopause is complete when you have missed 12 consecutive menstrual periods. It is important to talk with your health care provider about some of the most common conditions that affect postmenopausal women, such as heart disease, cancer, and bone loss (osteoporosis). Adopting a healthy lifestyle and getting preventive care can help to promote your health and wellness. Those actions can also lower your chances of developing some of these common conditions. What should I know about menopause? During menopause, you may experience a number of symptoms, such as:  Moderate-to-severe hot flashes.  Night sweats.  Decrease in sex drive.  Mood swings.  Headaches.  Tiredness.  Irritability.  Memory problems.  Insomnia.  Choosing to treat or not to treat menopausal changes is an individual decision that you make with your health care provider. What should I know about hormone replacement therapy and supplements? Hormone therapy products are effective for treating symptoms that are associated with menopause, such as hot flashes and night sweats. Hormone replacement carries certain risks, especially as you become older. If you are thinking about using estrogen or estrogen  with progestin treatments, discuss the benefits and risks with your health care provider. What should I know about heart disease and stroke? Heart disease, heart attack, and stroke become more likely as you age. This may be due, in part, to the hormonal changes that your body experiences during menopause. These can affect how your body processes dietary fats, triglycerides, and cholesterol. Heart attack and stroke are both medical emergencies. There are many things that you can do to help prevent heart disease and stroke:  Have your blood pressure checked at least every 1-2 years. High blood pressure causes heart disease and increases the risk of stroke.  If you are 3-36 years old, ask your health care provider if you should take aspirin to prevent a heart attack or a stroke.  Do not use any tobacco products, including cigarettes, chewing tobacco, or electronic cigarettes. If you need help quitting, ask your health care provider.  It is important to eat a healthy diet and maintain a healthy weight. ? Be sure to include plenty of vegetables, fruits, low-fat dairy products, and lean protein. ? Avoid eating foods that are high in solid fats, added sugars, or salt (sodium).  Get regular exercise. This is one of the most important things that you can do for your health. ? Try to exercise for at least 150 minutes each week. The type of exercise that you do should increase your heart rate and make you sweat. This is known as moderate-intensity exercise. ? Try to do strengthening exercises at least twice each week. Do these in addition to the moderate-intensity exercise.  Know your numbers.Ask your health care provider to  check your cholesterol and your blood glucose. Continue to have your blood tested as directed by your health care provider.  What should I know about cancer screening? There are several types of cancer. Take the following steps to reduce your risk and to catch any cancer development  as early as possible. Breast Cancer  Practice breast self-awareness. ? This means understanding how your breasts normally appear and feel. ? It also means doing regular breast self-exams. Let your health care provider know about any changes, no matter how small.  If you are 36 or older, have a clinician do a breast exam (clinical breast exam or CBE) every year. Depending on your age, family history, and medical history, it may be recommended that you also have a yearly breast X-ray (mammogram).  If you have a family history of breast cancer, talk with your health care provider about genetic screening.  If you are at high risk for breast cancer, talk with your health care provider about having an MRI and a mammogram every year.  Breast cancer (BRCA) gene test is recommended for women who have family members with BRCA-related cancers. Results of the assessment will determine the need for genetic counseling and BRCA1 and for BRCA2 testing. BRCA-related cancers include these types: ? Breast. This occurs in males or females. ? Ovarian. ? Tubal. This may also be called fallopian tube cancer. ? Cancer of the abdominal or pelvic lining (peritoneal cancer). ? Prostate. ? Pancreatic.  Cervical, Uterine, and Ovarian Cancer Your health care provider may recommend that you be screened regularly for cancer of the pelvic organs. These include your ovaries, uterus, and vagina. This screening involves a pelvic exam, which includes checking for microscopic changes to the surface of your cervix (Pap test).  For women ages 21-65, health care providers may recommend a pelvic exam and a Pap test every three years. For women ages 55-65, they may recommend the Pap test and pelvic exam, combined with testing for human papilloma virus (HPV), every five years. Some types of HPV increase your risk of cervical cancer. Testing for HPV may also be done on women of any age who have unclear Pap test results.  Other health  care providers may not recommend any screening for nonpregnant women who are considered low risk for pelvic cancer and have no symptoms. Ask your health care provider if a screening pelvic exam is right for you.  If you have had past treatment for cervical cancer or a condition that could lead to cancer, you need Pap tests and screening for cancer for at least 20 years after your treatment. If Pap tests have been discontinued for you, your risk factors (such as having a new sexual partner) need to be reassessed to determine if you should start having screenings again. Some women have medical problems that increase the chance of getting cervical cancer. In these cases, your health care provider may recommend that you have screening and Pap tests more often.  If you have a family history of uterine cancer or ovarian cancer, talk with your health care provider about genetic screening.  If you have vaginal bleeding after reaching menopause, tell your health care provider.  There are currently no reliable tests available to screen for ovarian cancer.  Lung Cancer Lung cancer screening is recommended for adults 55-55 years old who are at high risk for lung cancer because of a history of smoking. A yearly low-dose CT scan of the lungs is recommended if you:  Currently smoke.  Have a history of at least 30 pack-years of smoking and you currently smoke or have quit within the past 15 years. A pack-year is smoking an average of one pack of cigarettes per day for one year.  Yearly screening should:  Continue until it has been 15 years since you quit.  Stop if you develop a health problem that would prevent you from having lung cancer treatment.  Colorectal Cancer  This type of cancer can be detected and can often be prevented.  Routine colorectal cancer screening usually begins at age 47 and continues through age 42.  If you have risk factors for colon cancer, your health care provider may  recommend that you be screened at an earlier age.  If you have a family history of colorectal cancer, talk with your health care provider about genetic screening.  Your health care provider may also recommend using home test kits to check for hidden blood in your stool.  A small camera at the end of a tube can be used to examine your colon directly (sigmoidoscopy or colonoscopy). This is done to check for the earliest forms of colorectal cancer.  Direct examination of the colon should be repeated every 5-10 years until age 38. However, if early forms of precancerous polyps or small growths are found or if you have a family history or genetic risk for colorectal cancer, you may need to be screened more often.  Skin Cancer  Check your skin from head to toe regularly.  Monitor any moles. Be sure to tell your health care provider: ? About any new moles or changes in moles, especially if there is a change in a mole's shape or color. ? If you have a mole that is larger than the size of a pencil eraser.  If any of your family members has a history of skin cancer, especially at a young age, talk with your health care provider about genetic screening.  Always use sunscreen. Apply sunscreen liberally and repeatedly throughout the day.  Whenever you are outside, protect yourself by wearing long sleeves, pants, a wide-brimmed hat, and sunglasses.  What should I know about osteoporosis? Osteoporosis is a condition in which bone destruction happens more quickly than new bone creation. After menopause, you may be at an increased risk for osteoporosis. To help prevent osteoporosis or the bone fractures that can happen because of osteoporosis, the following is recommended:  If you are 59-18 years old, get at least 1,000 mg of calcium and at least 600 mg of vitamin D per day.  If you are older than age 17 but younger than age 30, get at least 1,200 mg of calcium and at least 600 mg of vitamin D per  day.  If you are older than age 20, get at least 1,200 mg of calcium and at least 800 mg of vitamin D per day.  Smoking and excessive alcohol intake increase the risk of osteoporosis. Eat foods that are rich in calcium and vitamin D, and do weight-bearing exercises several times each week as directed by your health care provider. What should I know about how menopause affects my mental health? Depression may occur at any age, but it is more common as you become older. Common symptoms of depression include:  Low or sad mood.  Changes in sleep patterns.  Changes in appetite or eating patterns.  Feeling an overall lack of motivation or enjoyment of activities that you previously enjoyed.  Frequent crying spells.  Talk with your  health care provider if you think that you are experiencing depression. What should I know about immunizations? It is important that you get and maintain your immunizations. These include:  Tetanus, diphtheria, and pertussis (Tdap) booster vaccine.  Influenza every year before the flu season begins.  Pneumonia vaccine.  Shingles vaccine.  Your health care provider may also recommend other immunizations. This information is not intended to replace advice given to you by your health care provider. Make sure you discuss any questions you have with your health care provider. Document Released: 07/21/2005 Document Revised: 12/17/2015 Document Reviewed: 03/02/2015 Elsevier Interactive Patient Education  2018 [place] Risks of Smoking Smoking cigarettes is very bad for your health. Tobacco smoke has over 200 known poisons in it. It contains the poisonous gases nitrogen oxide and carbon monoxide. There are over 60 chemicals in tobacco smoke that cause cancer. Smoking is difficult to quit because a chemical in tobacco, called nicotine, causes addiction or dependence. When you smoke and inhale, nicotine is absorbed rapidly into the bloodstream through  your lungs. Both inhaled and non-inhaled nicotine may be addictive. What are the risks of cigarette smoke? Cigarette smokers have an increased risk of many serious medical problems, including:  Lung cancer.  Lung disease, such as pneumonia, bronchitis, and emphysema.  Chest pain (angina) and heart attack because the heart is not getting enough oxygen.  Heart disease and peripheral blood vessel disease.  High blood pressure (hypertension).  Stroke.  Oral cancer, including cancer of the lip, mouth, or voice box.  Bladder cancer.  Pancreatic cancer.  Cervical cancer.  Pregnancy complications, including premature birth.  Stillbirths and smaller newborn babies, birth defects, and genetic damage to sperm.  Early menopause.  Lower estrogen level for women.  Infertility.  Facial wrinkles.  Blindness.  Increased risk of broken bones (fractures).  Senile dementia.  Stomach ulcers and internal bleeding.  Delayed wound healing and increased risk of complications during surgery.  Even smoking lightly shortens your life expectancy by several years.  Because of secondhand smoke exposure, children of smokers have an increased risk of the following:  Sudden infant death syndrome (SIDS).  Respiratory infections.  Lung cancer.  Heart disease.  Ear infections.  What are the benefits of quitting? There are many health benefits of quitting smoking. Here are some of them:  Within days of quitting smoking, your risk of having a heart attack decreases, your blood flow improves, and your lung capacity improves. Blood pressure, pulse rate, and breathing patterns start returning to normal soon after quitting.  Within months, your lungs may clear up completely.  Quitting for 10 years reduces your risk of developing lung cancer and heart disease to almost that of a nonsmoker.  People who quit may see an improvement in their overall quality of life.  How do I quit  smoking? Smoking is an addiction with both physical and psychological effects, and longtime habits can be hard to change. Your health care provider can recommend:  Programs and community resources, which may include group support, education, or talk therapy.  Prescription medicines to help reduce cravings.  Nicotine replacement products, such as patches, gum, and nasal sprays. Use these products only as directed. Do not replace cigarette smoking with electronic cigarettes, which are commonly called e-cigarettes. The safety of e-cigarettes is not known, and some may contain harmful chemicals.  A combination of two or more of these methods.  Where to find more information:  American Lung Association: www.lung.org  American Cancer  Society: www.cancer.org Summary  Smoking cigarettes is very bad for your health. Cigarette smokers have an increased risk of many serious medical problems, including several cancers, heart disease, and stroke.  Smoking is an addiction with both physical and psychological effects, and longtime habits can be hard to change.  By stopping right away, you can greatly reduce the risk of medical problems for you and your family.  To help you quit smoking, your health care provider can recommend programs, community resources, prescription medicines, and nicotine replacement products such as patches, gum, and nasal sprays. This information is not intended to replace advice given to you by your health care provider. Make sure you discuss any questions you have with your health care provider. Document Released: 07/06/2004 Document Revised: 06/02/2016 Document Reviewed: 06/02/2016 Elsevier Interactive Patient Education  2017 [employer].

## 2017-04-03 LAB — CBC WITH DIFFERENTIAL/PLATELET
BASOS ABS: 0 10*3/uL (ref 0.0–0.1)
Basophils Relative: 0.6 % (ref 0.0–3.0)
EOS ABS: 0.3 10*3/uL (ref 0.0–0.7)
Eosinophils Relative: 3.5 % (ref 0.0–5.0)
HCT: 42.3 % (ref 36.0–46.0)
HEMOGLOBIN: 13.7 g/dL (ref 12.0–15.0)
Lymphocytes Relative: 46.6 % — ABNORMAL HIGH (ref 12.0–46.0)
Lymphs Abs: 3.4 10*3/uL (ref 0.7–4.0)
MCHC: 32.3 g/dL (ref 30.0–36.0)
MCV: 92.7 fl (ref 78.0–100.0)
MONO ABS: 0.5 10*3/uL (ref 0.1–1.0)
Monocytes Relative: 7.1 % (ref 3.0–12.0)
Neutro Abs: 3.1 10*3/uL (ref 1.4–7.7)
Neutrophils Relative %: 42.2 % — ABNORMAL LOW (ref 43.0–77.0)
Platelets: 367 10*3/uL (ref 150.0–400.0)
RBC: 4.56 Mil/uL (ref 3.87–5.11)
RDW: 14.3 % (ref 11.5–15.5)
WBC: 7.3 10*3/uL (ref 4.0–10.5)

## 2017-04-03 LAB — COMPREHENSIVE METABOLIC PANEL
ALBUMIN: 4 g/dL (ref 3.5–5.2)
ALK PHOS: 72 U/L (ref 39–117)
ALT: 16 U/L (ref 0–35)
AST: 15 U/L (ref 0–37)
BILIRUBIN TOTAL: 0.4 mg/dL (ref 0.2–1.2)
BUN: 13 mg/dL (ref 6–23)
CO2: 31 mEq/L (ref 19–32)
CREATININE: 0.79 mg/dL (ref 0.40–1.20)
Calcium: 9.5 mg/dL (ref 8.4–10.5)
Chloride: 104 mEq/L (ref 96–112)
GFR: 99.01 mL/min (ref >60.00)
Glucose, Bld: 84 mg/dL (ref 70–99)
POTASSIUM: 4.1 meq/L (ref 3.5–5.1)
SODIUM: 142 meq/L (ref 135–145)
TOTAL PROTEIN: 6.6 g/dL (ref 6.0–8.3)

## 2017-04-03 LAB — LIPID PANEL
CHOLESTEROL: 163 mg/dL (ref 0–200)
HDL: 50.3 mg/dL (ref >39.00)
LDL Cholesterol: 93 mg/dL (ref 0–99)
NonHDL: 112.72
Total CHOL/HDL Ratio: 3
Triglycerides: 98 mg/dL (ref 0.0–149.0)
VLDL: 19.6 mg/dL (ref 0.0–40.0)

## 2017-04-03 LAB — TSH: TSH: 1.27 u[IU]/mL (ref 0.35–4.50)

## 2017-05-07 ENCOUNTER — Encounter: Payer: Self-pay | Admitting: Internal Medicine

## 2017-08-31 ENCOUNTER — Other Ambulatory Visit: Payer: Self-pay | Admitting: Internal Medicine

## 2017-09-25 ENCOUNTER — Ambulatory Visit (INDEPENDENT_AMBULATORY_CARE_PROVIDER_SITE_OTHER): Payer: BLUE CROSS/BLUE SHIELD

## 2017-09-25 ENCOUNTER — Other Ambulatory Visit: Payer: Self-pay | Admitting: Podiatry

## 2017-09-25 ENCOUNTER — Ambulatory Visit: Payer: BLUE CROSS/BLUE SHIELD | Admitting: Podiatry

## 2017-09-25 ENCOUNTER — Encounter: Payer: Self-pay | Admitting: Podiatry

## 2017-09-25 DIAGNOSIS — M2041 Other hammer toe(s) (acquired), right foot: Secondary | ICD-10-CM

## 2017-09-25 DIAGNOSIS — M2012 Hallux valgus (acquired), left foot: Secondary | ICD-10-CM | POA: Diagnosis not present

## 2017-09-25 DIAGNOSIS — M205X2 Other deformities of toe(s) (acquired), left foot: Secondary | ICD-10-CM | POA: Diagnosis not present

## 2017-09-25 DIAGNOSIS — M779 Enthesopathy, unspecified: Secondary | ICD-10-CM

## 2017-09-25 DIAGNOSIS — M2042 Other hammer toe(s) (acquired), left foot: Principal | ICD-10-CM

## 2017-09-25 DIAGNOSIS — M778 Other enthesopathies, not elsewhere classified: Secondary | ICD-10-CM

## 2017-09-25 DIAGNOSIS — M722 Plantar fascial fibromatosis: Secondary | ICD-10-CM

## 2017-09-25 MED ORDER — MELOXICAM 15 MG PO TABS: 15.0000 mg | ORAL_TABLET | Freq: Every day | ORAL | 3 refills | Status: DC

## 2017-09-26 NOTE — Progress Notes (Signed)
  Subjective:  Patient ID: Patricia [name], female    DOB: 1965/07/29,  MRN: [id number] HPI Chief Complaint  Patient presents with  . Foot Pain    1st MPJ left - bunion deformity for years, starting to ache more, tried wider shoes, gets red and swollen  . New Patient (Initial Visit)    52 y.o. female presents with the above complaint.   ROS: Denies fever chills nausea vomiting muscle aches pains chest pain calf pain back pain shortness of breath headache.  Past Medical History:  Diagnosis Date  . Hypertension    Past Surgical History:  Procedure Laterality Date  . ABDOMINAL HYSTERECTOMY    . BREAST REDUCTION SURGERY      Current Outpatient Medications:  .  meloxicam (MOBIC) 15 MG tablet, Take 1 tablet (15 mg total) by mouth daily., Disp: 30 tablet, Rfl: 3  No Known Allergies Review of Systems Objective:  There were no vitals filed for this visit.  General: Well developed, nourished, in no acute distress, alert and oriented x3   Dermatological: Skin is warm, dry and supple bilateral. Nails x 10 are well maintained; remaining integument appears unremarkable at this time. There are no open sores, no preulcerative lesions, no rash or signs of infection present.  Vascular: Dorsalis Pedis artery and Posterior Tibial artery pedal pulses are 2/4 bilateral with immedate capillary fill time. Pedal hair growth present. No varicosities and no lower extremity edema present bilateral.   Neruologic: Grossly intact via light touch bilateral. Vibratory intact via tuning fork bilateral. Protective threshold with Semmes Wienstein monofilament intact to all pedal sites bilateral. Patellar and Achilles deep tendon reflexes 2+ bilateral. No Babinski or clonus noted bilateral.   Musculoskeletal: No gross boney pedal deformities bilateral. No pain, crepitus, or limitation noted with foot and ankle range of motion bilateral. Muscular strength 5/5 in all groups tested bilateral.  Gait: Unassisted,  Nonantalgic.    Radiographs:  Radiographs taken today demonstrate rectus foot.  Mild hallux valgus deformity with lateral deviation.  There appears to have been some sesamoidal injury at some point of life with old fractures and some early osteoarthritic changes.  Otherwise no acute findings. Assessment & Plan:   Assessment: Capsulitis with mild hallux limitus and hallux valgus deformity first metatarsophalangeal joint left foot.  Plan: Discussed etiology pathology conservative versus surgical therapies at this point in time I have discussed in great detail with her surgical intervention however at this point she does not have time to have the surgery done so she would like to consider an injection.  After sterile Betadine skin prep I injected 2 cc of dexamethasone and local anesthetic directly into the first metatarsal phalangeal joint.  She tolerated the procedure well without complications.  I will follow-up with her in 1 month.  I also started her on meloxicam 15 mg 1 tablet daily      T. [place], [place]

## 2017-11-01 ENCOUNTER — Ambulatory Visit: Payer: BLUE CROSS/BLUE SHIELD | Admitting: Podiatry

## 2017-11-01 ENCOUNTER — Encounter: Payer: Self-pay | Admitting: Podiatry

## 2017-11-01 DIAGNOSIS — M205X2 Other deformities of toe(s) (acquired), left foot: Secondary | ICD-10-CM | POA: Diagnosis not present

## 2017-11-01 DIAGNOSIS — M779 Enthesopathy, unspecified: Secondary | ICD-10-CM | POA: Diagnosis not present

## 2017-11-01 DIAGNOSIS — M778 Other enthesopathies, not elsewhere classified: Secondary | ICD-10-CM

## 2017-11-01 NOTE — Progress Notes (Signed)
She presents today for follow-up of her capsulitis first metatarsophalangeal joint of her left foot.  She states that was doing well for a while the swelling went down but is still hurts.  I am ready to have something done is starting to affect my ability to perform her daily activities and interferes with my life.  Objective: Vital signs are stable alert and oriented x3 I have reviewed her past medical history medications allergy surgeries social history and review of systems.  Pulses are palpable.  She still has pain on palpation of the first metatarsophalangeal joint and on range of motion.  Prominent reactive bone growth to the medial aspect of the head of the first metatarsal is painful as well.  Right metatarsal phalangeal joint as 90 degrees dorsiflexion limiting about 20 to 30 degrees of dorsiflexion for the left foot.  Reviewed radiographs which do demonstrate joint space narrowing subchondral sclerosis dorsal spurring and an increase in the first intermetatarsal angle and hallux abductus angle.  No open lesions or wounds are noted.  Assessment: Pain in limb secondary to hallux limitus first metatarsophalangeal joint left foot.  Plan: Discussed etiology pathology conservative surgical therapies at this point time we consented her for an Baltazar Apo quick osteotomy with screw first metatarsal left foot.  She understands completely the procedure.  We discussed appropriate healing and surgery for this she understands that she will be at a outpatient surgery center we provided her paperwork with oral and written regarding anesthesia and the facility.  We did discuss the possible postop complications which may include but not limited to postop pain bleeding swelling infection recurrence need for further surgery overcorrection under correction loss of digit loss of limb loss of life.  We dispensed a Cam walker today and I will follow-up with her in the near future for surgical intervention.

## 2017-11-01 NOTE — Patient Instructions (Signed)
Pre-Operative Instructions  Congratulations, you have decided to take an important step towards improving your quality of life.  You can be assured that the doctors and staff at [facility] will be with you every step of the way.  Here are some important things you should know:  1. Plan to be at the surgery center/hospital at least 1 (one) hour prior to your scheduled time, unless otherwise directed by the surgical center/hospital staff.  You must have a responsible adult accompany you, remain during the surgery and drive you home.  Make sure you have directions to the surgical center/hospital to ensure you arrive on time. 2. If you are having surgery at [facility] or [facility], you will need a copy of your medical history and physical form from your family physician within one month prior to the date of surgery. We will give you a form for your primary physician to complete.  3. We make every effort to accommodate the date you request for surgery.  However, there are times where surgery dates or times have to be moved.  We will contact you as soon as possible if a change in schedule is required.   4. No aspirin/ibuprofen for one week before surgery.  If you are on aspirin, any non-steroidal anti-inflammatory medications (Mobic, Aleve, Ibuprofen) should not be taken seven (7) days prior to your surgery.  You make take Tylenol for pain prior to surgery.  5. Medications - If you are taking daily heart and blood pressure medications, seizure, reflux, allergy, asthma, anxiety, pain or diabetes medications, make sure you notify the surgery center/hospital before the day of surgery so they can tell you which medications you should take or avoid the day of surgery. 6. No food or drink after midnight the night before surgery unless directed otherwise by surgical center/hospital staff. 7. No alcoholic beverages 24-hours prior to surgery.  No smoking 24-hours prior or 24-hours after  surgery. 8. Wear loose pants or shorts. They should be loose enough to fit over bandages, boots, and casts. 9. Don't wear slip-on shoes. Sneakers are preferred. 10. Bring your boot with you to the surgery center/hospital.  Also bring crutches or a walker if your physician has prescribed it for you.  If you do not have this equipment, it will be provided for you after surgery. 11. If you have not been contacted by the surgery center/hospital by the day before your surgery, call to confirm the date and time of your surgery. 12. Leave-time from work may vary depending on the type of surgery you have.  Appropriate arrangements should be made prior to surgery with your employer. 13. Prescriptions will be provided immediately following surgery by your doctor.  Fill these as soon as possible after surgery and take the medication as directed. Pain medications will not be refilled on weekends and must be approved by the doctor. 14. Remove nail polish on the operative foot and avoid getting pedicures prior to surgery. 15. Wash the night before surgery.  The night before surgery wash the foot and leg well with water and the antibacterial soap provided. Be sure to pay special attention to beneath the toenails and in between the toes.  Wash for at least three (3) minutes. Rinse thoroughly with water and dry well with a towel.  Perform this wash unless told not to do so by your physician.  Enclosed: 1 Ice pack (please put in freezer the night before surgery)   1 Hibiclens skin cleaner     Pre-op instructions  If you have any questions regarding the instructions, please do not hesitate to call our office.  [place]: [address], [place], [place] [address] -- [phone]  [place]: [address] [place], [place] [address] -- [phone]  [place]: [address]  [place], [place] [address] -- [phone]  [place]: [address], [address], [place], [place] [address] -- [phone]  Website:  [email or web address] 

## 2018-01-15 ENCOUNTER — Telehealth: Payer: Self-pay | Admitting: Family Medicine

## 2018-01-15 DIAGNOSIS — Z1211 Encounter for screening for malignant neoplasm of colon: Secondary | ICD-10-CM

## 2018-01-15 NOTE — Telephone Encounter (Signed)
Copied from [place]. Topic: Appointment Scheduling - Scheduling Inquiry for Clinic >> Jan 15, 2018 11:32 AM Burchel, Abbi R wrote: Reason for CRM:   Pt would like to sched Cologuard screening. Please call pt to sched.  Pt: [phone]

## 2018-01-16 NOTE — Telephone Encounter (Signed)
Left message to return phone call to see if she is willing to do full colonoscopy.

## 2018-01-16 NOTE — Telephone Encounter (Signed)
Pt had an ov for [name] 03/2017. Does she need to come in for OV for cologaurd?

## 2018-01-16 NOTE — Telephone Encounter (Signed)
No, patient does not need to come in for a Cologuard but make patient aware that at age 52 I would prefer that her initial colon cancer screening test be a full colonoscopy

## 2018-01-17 ENCOUNTER — Encounter: Payer: Self-pay | Admitting: Gastroenterology

## 2018-01-17 NOTE — Telephone Encounter (Signed)
Spoke to pt and informed her of Dr.Kwiatkowski's advise. Pt verbalized understanding. Referral for GI placed for coloscopy. No further action needed!

## 2018-01-22 IMAGING — CT CT ANGIO CHEST
1 of 7 series · 18 of 36 positions shown · IV contrast (Iodine)
Comparison: None.

CLINICAL DATA: Syncope

EXAM:
CT ANGIOGRAPHY CHEST WITH CONTRAST
TECHNIQUE: Multidetector CT imaging of the chest was performed using the
standard protocol during bolus administration of intravenous
contrast. Multiplanar CT image reconstructions and MIPs were
obtained to evaluate the vascular anatomy.
CONTRAST:  100 cc Isovue 370

[Series 407: thins pacs · axial · 0.88mm/px · z∈[+68,+333]mm · 18 of 297 slices shown]
[im 16/297  lung]
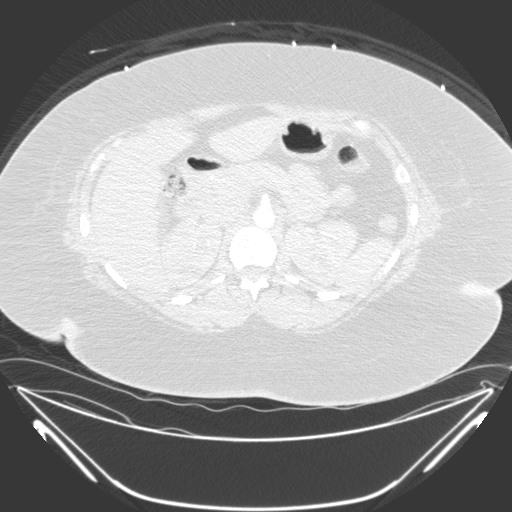
[im 32/297  mediastinal]
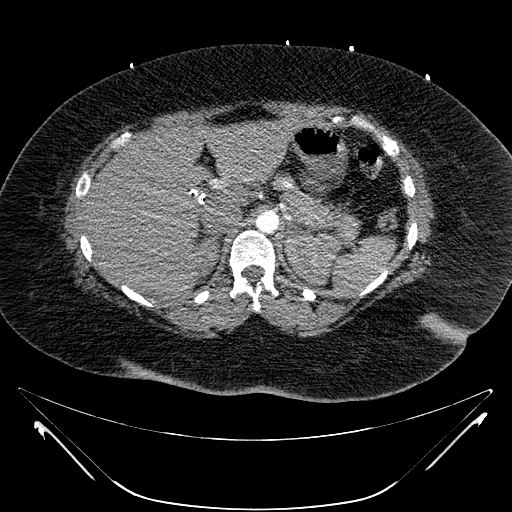
[im 47/297  lung]
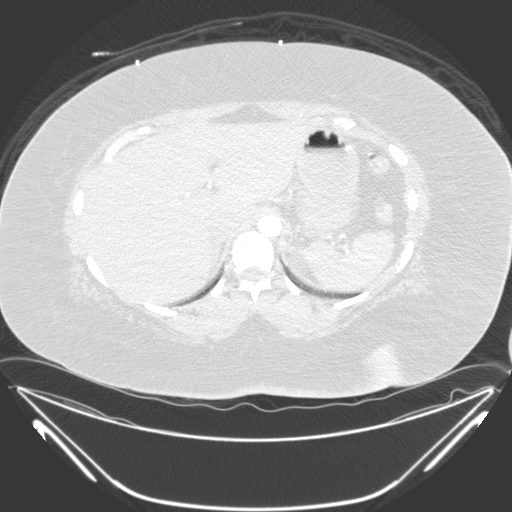
[im 63/297  mediastinal]
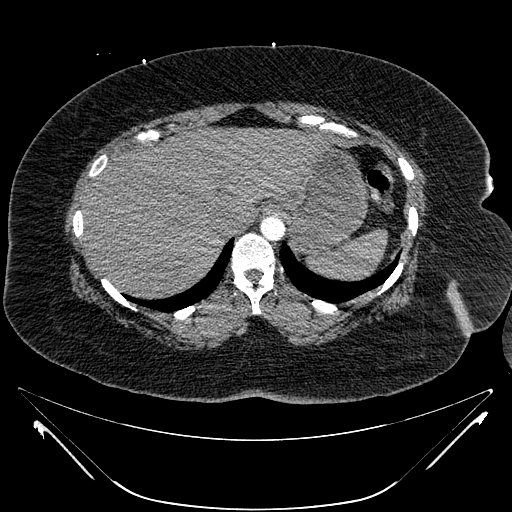
[im 78/297  lung]
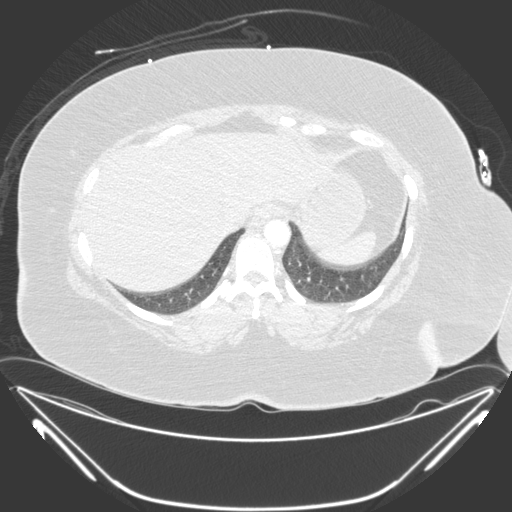
[im 94/297  mediastinal]
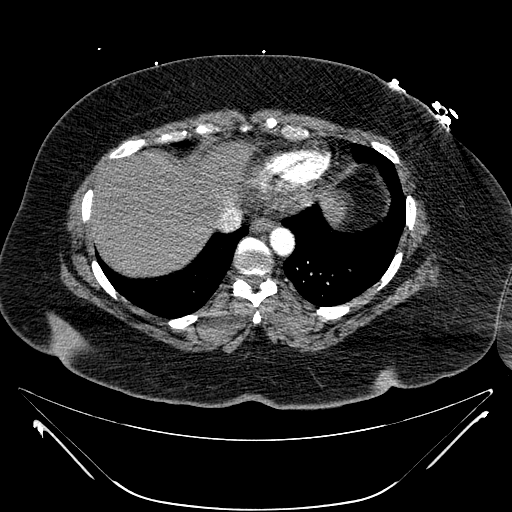
[im 110/297  lung]
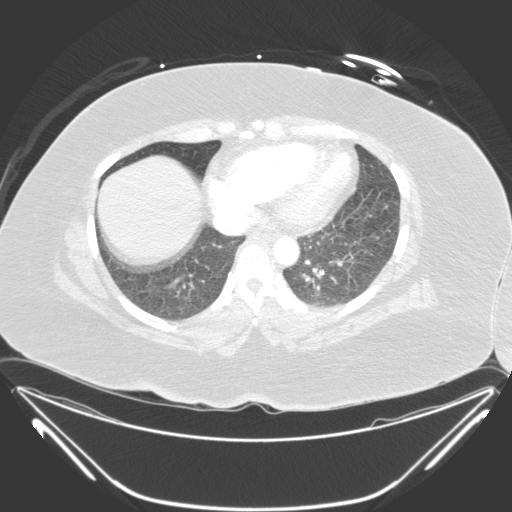
[im 125/297  mediastinal]
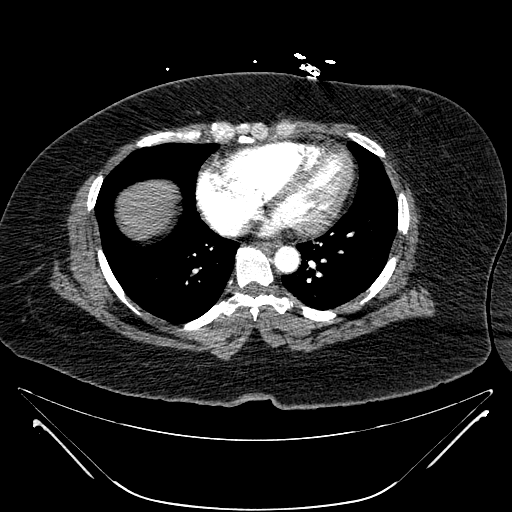
[im 141/297  lung]
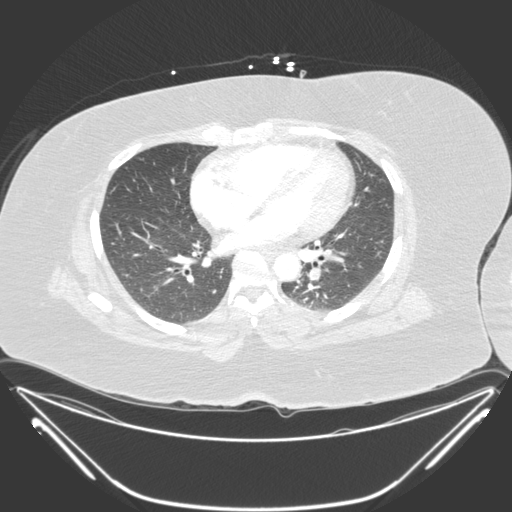
[im 156/297  mediastinal]
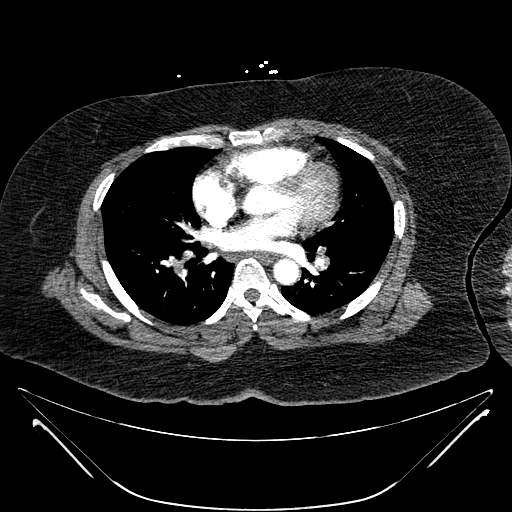
[im 172/297  lung]
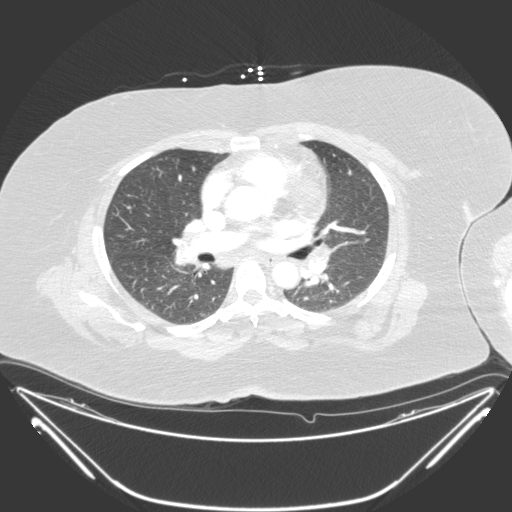
[im 187/297  mediastinal]
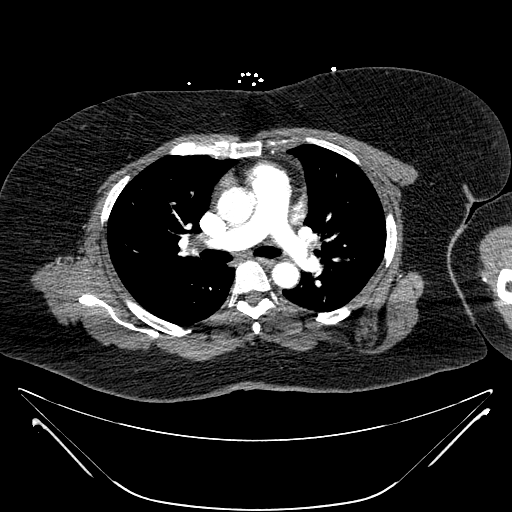
[im 203/297  lung]
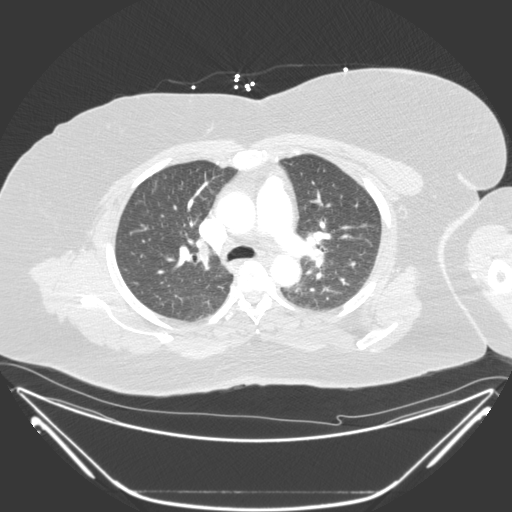
[im 219/297  mediastinal]
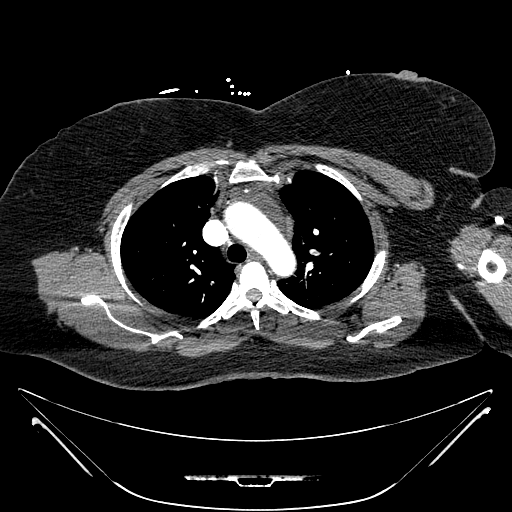
[im 234/297  lung]
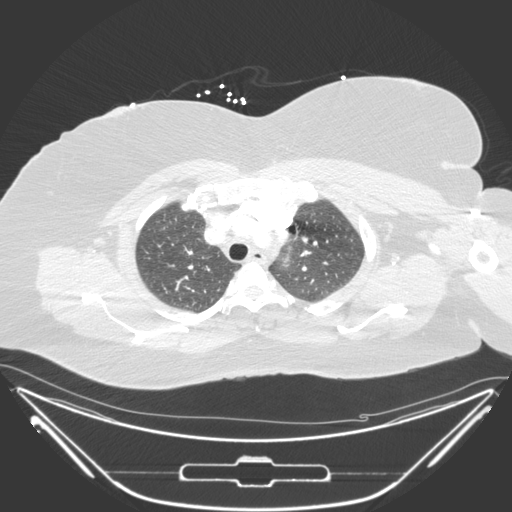
[im 250/297  mediastinal]
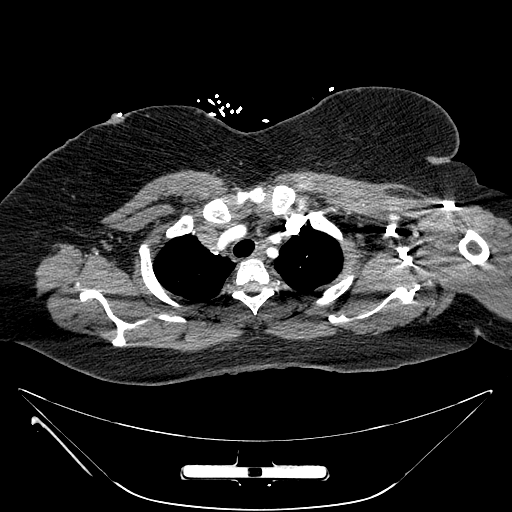
[im 265/297  lung]
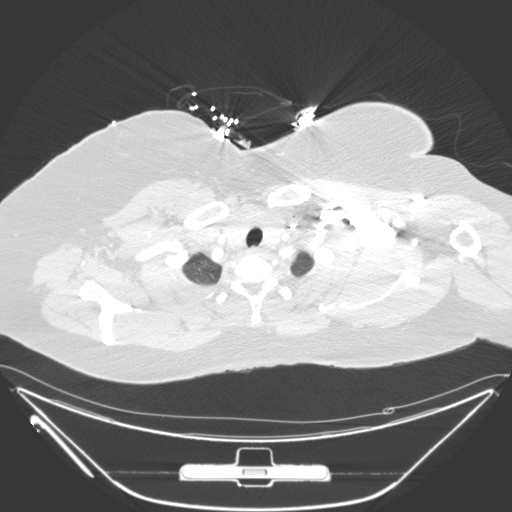
[im 281/297  mediastinal]
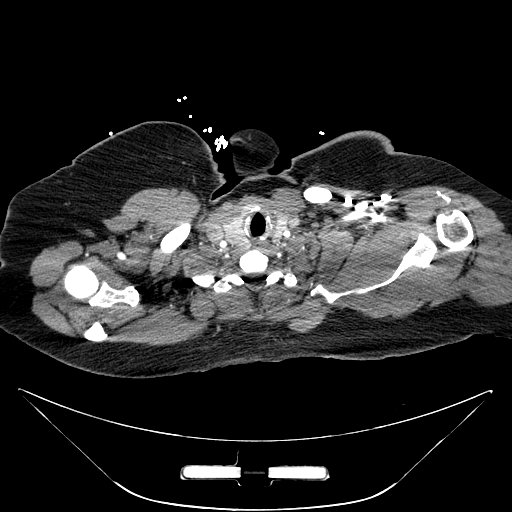

[18 of 36 positions shown; findings below may reference images not displayed]

FINDINGS: There is acute bilateral pulmonary thromboembolism. Irregular
filling defect is present in the peripheral right pulmonary artery
extending into lobar and segmental branches. There is also a small
amount of thrombus burden in the peripheral left pulmonary artery
extending into left upper and lower lobe lobar and segmental
branches. Right ventricle the left ventricle ratio is 1.3 consistent
with mild right heart strain.

No evidence of aortic dissection. No abnormal mediastinal
adenopathy.

No pneumothorax.  No pleural effusion

Scattered minimal subsegmental atelectasis in the lungs. No mass or
consolidation.

No acute bony deformity.

Postcholecystectomy.

Review of the MIP images confirms the above findings.
IMPRESSION: The study is positive for bilateral acute pulmonary thromboembolism,
associated with mild right heart strain. Critical Value/emergent
results were called by telephone at the time of interpretation on
02/09/2016 at [DATE] to Dr. TASNEEM MARTINSON , who verbally
acknowledged these results.

## 2018-02-13 ENCOUNTER — Encounter: Payer: BLUE CROSS/BLUE SHIELD | Admitting: Family Medicine

## 2018-02-28 ENCOUNTER — Encounter: Payer: BLUE CROSS/BLUE SHIELD | Admitting: Family Medicine

## 2018-03-05 ENCOUNTER — Encounter: Payer: Self-pay | Admitting: Family Medicine

## 2018-03-05 ENCOUNTER — Encounter: Payer: Self-pay | Admitting: Gastroenterology

## 2018-03-05 ENCOUNTER — Ambulatory Visit (HOSPITAL_COMMUNITY)
Admission: RE | Admit: 2018-03-05 | Discharge: 2018-03-05 | Disposition: A | Discharge status: Home / Self Care | Payer: BLUE CROSS/BLUE SHIELD | Source: Ambulatory Visit | Attending: Family Medicine | Admitting: Family Medicine

## 2018-03-05 ENCOUNTER — Telehealth: Payer: Self-pay | Admitting: Internal Medicine

## 2018-03-05 ENCOUNTER — Ambulatory Visit (AMBULATORY_SURGERY_CENTER): Payer: Self-pay

## 2018-03-05 ENCOUNTER — Ambulatory Visit: Payer: BLUE CROSS/BLUE SHIELD | Admitting: Family Medicine

## 2018-03-05 VITALS — BP 124/80 | HR 87 | Temp 98.4°F | Resp 12 | Ht 67.0 in | Wt 272.0 lb

## 2018-03-05 VITALS — Ht 67.0 in | Wt 270.8 lb

## 2018-03-05 DIAGNOSIS — Z720 Tobacco use: Secondary | ICD-10-CM | POA: Diagnosis not present

## 2018-03-05 DIAGNOSIS — Z86711 Personal history of pulmonary embolism: Secondary | ICD-10-CM | POA: Diagnosis not present

## 2018-03-05 DIAGNOSIS — R590 Localized enlarged lymph nodes: Secondary | ICD-10-CM | POA: Diagnosis not present

## 2018-03-05 DIAGNOSIS — M79662 Pain in left lower leg: Secondary | ICD-10-CM

## 2018-03-05 DIAGNOSIS — Z1211 Encounter for screening for malignant neoplasm of colon: Secondary | ICD-10-CM

## 2018-03-05 LAB — D-DIMER, QUANTITATIVE: D-Dimer, Quant: 0.5 mcg/mL FEU — ABNORMAL HIGH (ref <0.50)

## 2018-03-05 MED ORDER — PEG 3350-KCL-NA BICARB-NACL 420 G PO SOLR: 4000.0000 mL | Freq: Once | ORAL | 0 refills | Status: AC

## 2018-03-05 NOTE — Progress Notes (Signed)
ACUTE VISIT   HPI:  Chief Complaint  Patient presents with  . Spasms    left leg, behind knee that radiates down leg started yesterday    [name] is a 52 y.o. female, who is here today complaining of left calf pain that started yesterday around 10 AM. Pain starts behind left knee and spreads to mid calf muscle. Pain is constant, "spasm-like", 8/10. She has not noted lower extremity edema or erythema. Has not noted a rash, numbness, or tingling.  Smoker, trying to decrease cigarettes per day.  She has history of PE in 2017. She denies chest pain, palpitations, dyspnea, or dizziness.  She is not on hormone therapy. No recent surgeries or trauma. This past weekend she drove 2 hours out of town and the next day 2 hours back to town. She has applied local heat and ice, she also try a pillow underneath extremity but nothing has helped with pain.  Review of Systems  Constitutional: Negative for chills, fatigue and fever.  Respiratory: Negative for cough, shortness of breath and stridor.   Cardiovascular: Negative for chest pain, palpitations and leg swelling.  Gastrointestinal: Negative for abdominal pain, nausea and vomiting.  Genitourinary: Negative for decreased urine volume, dysuria and hematuria.  Musculoskeletal: Positive for myalgias. Negative for back pain and gait problem.  Skin: Negative for pallor and rash.  Neurological: Negative for weakness, numbness and headaches.    No current outpatient medications on file prior to visit.   No current facility-administered medications on file prior to visit.      Past Medical History:  Diagnosis Date  . Hypertension    No Known Allergies  Social History   Socioeconomic History  . Marital status: Married    Spouse name: Not on file  . Number of children: Not on file  . Years of education: Not on file  . Highest education level: Not on file  Occupational History  . Not on file  Social Needs    . Financial resource strain: Not on file  . Food insecurity:    Worry: Not on file    Inability: Not on file  . Transportation needs:    Medical: Not on file    Non-medical: Not on file  Tobacco Use  . Smoking status: Current Some Day Smoker    Packs/day: 0.30    Types: Cigarettes  . Smokeless tobacco: Never Used  . Tobacco comment: using electronic cigarettes too  Substance and Sexual Activity  . Alcohol use: Yes    Comment: occasional  . Drug use: No  . Sexual activity: Not on file  Lifestyle  . Physical activity:    Days per week: Not on file    Minutes per session: Not on file  . Stress: Not on file  Relationships  . Social connections:    Talks on phone: Not on file    Gets together: Not on file    Attends religious service: Not on file    Active member of club or organization: Not on file    Attends meetings of clubs or organizations: Not on file    Relationship status: Not on file  Other Topics Concern  . Not on file  Social History Narrative  . Not on file    Vitals:   03/05/18 1102  BP: 124/80  Pulse: 87  Resp: 12  Temp: 98.4 F (36.9 C)  SpO2: 100%   Body mass index is 42.6 kg/m.  Physical Exam  Nursing note and vitals reviewed. Constitutional: She is oriented to person, place, and time. She appears well-developed. She does not appear ill. No distress.  HENT:  Head: Normocephalic and atraumatic.  Mouth/Throat: Oropharynx is clear and moist and mucous membranes are normal.  Eyes: Pupils are equal, round, and reactive to light. Conjunctivae are normal.  Cardiovascular: Normal rate and regular rhythm.  No murmur heard. Pulses:      Dorsalis pedis pulses are 2+ on the right side, and 2+ on the left side.  Calf tenderness elicited with Homans sign left LE.   Respiratory: Effort normal. No respiratory distress.  GI: Soft. She exhibits no mass. There is no tenderness.  Musculoskeletal: She exhibits tenderness. She exhibits no edema.       Left  knee: She exhibits normal range of motion, no effusion and no erythema. No tenderness found.  No pain elicited with left knee flexion or extension. No significant difference on calves diameter.  Neurological: She is alert and oriented to person, place, and time. She has normal strength. Gait normal.  Skin: Skin is warm. No rash noted. No erythema.  Psychiatric: Her mood appears anxious.  Well groomed, good eye contact.      ASSESSMENT AND PLAN:   [name] was seen today for spasms.  Diagnoses and all orders for this visit:  Pain of left calf  No edema or erythema but because calf pain with Homans maneuver and history of PE + tobacco use, I think is appropriate to evaluate for DVT. We discussed other possible etiologies, musculoskeletal/leg cramps. She was instructed about warning signs.  Further recommendation will be given according to lab/imaging results. -     VAS [place] LOWER EXTREMITY VENOUS (DVT); Future -     D-dimer, Quantitative  Tobacco abuse  We discussed benefits of smoking cessation and adverse effects of tobacco use. Encourage to continue working on smoking cessation. She could consider pharmacologic treatment and discuss with PCP if she is interested in doing so.  Hx of pulmonary embolus  We discussed risk factors, some she can try to modify (tobacco). D-dimer was also ordered. Explained that if she develops a second episode of thrombotic event, she will need to be on lifetime anticoagulation.  -     D-dimer, Quantitative    Return if symptoms worsen or fail to improve.     Betty G. Martinique, MD  [facility]. [place] office.

## 2018-03-05 NOTE — Patient Instructions (Signed)
A few things to remember from today's visit:   Pain of left calf - Plan: VAS [place] LOWER EXTREMITY VENOUS (DVT), D-dimer, Quantitative  Tobacco abuse  Hx of pulmonary embolus - Plan: D-dimer, Quantitative   Please be sure medication list is accurate. If a new problem present, please set up appointment sooner than planned today.

## 2018-03-05 NOTE — Progress Notes (Signed)
Denies allergies to eggs or soy products. Denies complication of anesthesia or sedation. Denies use of weight loss medication. Denies use of O2.   Emmi instructions declined.  

## 2018-03-05 NOTE — Telephone Encounter (Signed)
Message sent to Dr. Martinique for review. Vascular center called and stated that patient was negative for DVT.

## 2018-03-05 NOTE — Telephone Encounter (Signed)
Copied from [place] [id number]. Topic: Quick Communication - See Telephone Encounter >> Mar 05, 2018  1:25 PM Bea Graff, NT wrote: CRM for notification. See Telephone encounter for: 03/05/18. Pt would like to know what her next steps should be for her left leg pain. Please advise.

## 2018-03-05 NOTE — Progress Notes (Signed)
LLE venous duplex prelim: negative for DVT. Landry Mellow, RDMS, RVT   Called results to [facility].

## 2018-03-14 ENCOUNTER — Encounter: Payer: BLUE CROSS/BLUE SHIELD | Admitting: Family Medicine

## 2018-03-15 ENCOUNTER — Ambulatory Visit: Payer: BLUE CROSS/BLUE SHIELD | Admitting: Family Medicine

## 2018-03-15 ENCOUNTER — Encounter: Payer: Self-pay | Admitting: Family Medicine

## 2018-03-15 VITALS — BP 130/80 | HR 86 | Temp 98.9°F | Wt 269.0 lb

## 2018-03-15 DIAGNOSIS — I10 Essential (primary) hypertension: Secondary | ICD-10-CM

## 2018-03-15 DIAGNOSIS — Z23 Encounter for immunization: Secondary | ICD-10-CM | POA: Diagnosis not present

## 2018-03-15 NOTE — Progress Notes (Signed)
Subjective:    Patient ID: Patricia [name], female    DOB: 1965-06-14, 52 y.o.   MRN: [id number]  Chief Complaint  Patient presents with  . Establish Care    HPI Patient was seen today for chronic issues and TOC, previously seen by Dr. Burnice Logan.  HTN: -not currently on meds -may check her bp at work, but no recent checks. -eating better, exercising 3x/wk ([place], wt training, etc) -drinking more water, cut out bread, sodas  H/o PE -pt endorses PE ~2 yrs ago -had L calf pain 9/24, but doppler was negative.  Nicotine use:  Pt states she is not smoking.  She states her last cigarette was Monday, but she threw it out.  Pt has a colonoscopy next wk. Is considering shingles vaccine, but wants to wait until after colonoscopy.  Social hx: At time of PE pt was under increased stress (work, husband--was going through divorce, and her daughter was pregnant).  Now pt states she has a beautiful grandson, she and her husband are back on track, and she deals with work stress better.  Pt works in the Immunologist at [employer].  Past Medical History:  Diagnosis Date  . Clotting disorder ([place])   . Hypertension     No Known Allergies  ROS General: Denies fever, chills, night sweats, changes in weight, changes in appetite HEENT: Denies headaches, ear pain, changes in vision, rhinorrhea, sore throat CV: Denies CP, palpitations, SOB, orthopnea Pulm: Denies SOB, cough, wheezing GI: Denies abdominal pain, nausea, vomiting, diarrhea, constipation GU: Denies dysuria, hematuria, frequency, vaginal discharge Msk: Denies muscle cramps, joint pains Neuro: Denies weakness, numbness, tingling Skin: Denies rashes, bruising Psych: Denies depression, anxiety, hallucinations     Objective:    Blood pressure 130/80, pulse 86, temperature 98.9 F (37.2 C), temperature source Oral, weight 269 lb (122 kg), SpO2 98 %.   Gen. Pleasant, well-nourished, in no distress, normal affect    Lungs: no accessory muscle use, CTAB, no wheezes or rales Cardiovascular: RRR, no m/r/g, no peripheral edema Neuro:  A&Ox3, CN II-XII intact, normal gait   Wt Readings from Last 3 Encounters:  03/15/18 269 lb (122 kg)  03/05/18 270 lb 12.8 oz (122.8 kg)  03/05/18 272 lb (123.4 kg)    Lab Results  Component Value Date   WBC 7.3 04/02/2017   HGB 13.7 04/02/2017   HCT 42.3 04/02/2017   PLT 367.0 04/02/2017   GLUCOSE 84 04/02/2017   CHOL 163 04/02/2017   TRIG 98.0 04/02/2017   HDL 50.30 04/02/2017   LDLCALC 93 04/02/2017   ALT 16 04/02/2017   AST 15 04/02/2017   NA 142 04/02/2017   K 4.1 04/02/2017   CL 104 04/02/2017   CREATININE 0.79 04/02/2017   BUN 13 04/02/2017   CO2 31 04/02/2017   TSH 1.27 04/02/2017    Assessment/Plan:  Essential hypertension -slightly elevated 130/80 -discussed lifestyle modifications  -pt to obtain bp cuff and record reading at home. -given handout  Influenza vaccine needed -influenza vaccine given  Discussed obtaining shingles vaccine in the next few wks.  Grier Mitts, MD

## 2018-03-15 NOTE — Patient Instructions (Signed)
Preventing Hypertension °Hypertension, commonly called high blood pressure, is when the force of blood pumping through the arteries is too strong. Arteries are blood vessels that carry blood from the heart throughout the body. Over time, hypertension can damage the arteries and decrease blood flow to important parts of the body, including the brain, heart, and kidneys. Often, hypertension does not cause symptoms until blood pressure is very high. For this reason, it is important to have your blood pressure checked on a regular basis. °Hypertension can often be prevented with diet and lifestyle changes. If you already have hypertension, you can control it with diet and lifestyle changes, as well as medicine. °What nutrition changes can be made? °Maintain a healthy diet. This includes: °· Eating less salt (sodium). Ask your health care provider how much sodium is safe for you to have. The general recommendation is to consume less than 1 tsp (2,300 mg) of sodium a day. °? Do not add salt to your food. °? Choose low-sodium options when grocery shopping and eating out. °· Limiting fats in your diet. You can do this by eating low-fat or fat-free dairy products and by eating less red meat. °· Eating more fruits, vegetables, and whole grains. Make a goal to eat: °? 1½-2 cups of fresh fruits and vegetables each day. °? 3-4 servings of whole grains each day. °· Avoiding foods and beverages that have added sugars. °· Eating fish that contain healthy fats (omega-3 fatty acids), such as mackerel or salmon. ° °If you need help putting together a healthy eating plan, try the DASH diet. This diet is high in fruits, vegetables, and whole grains. It is low in sodium, red meat, and added sugars. DASH stands for Dietary Approaches to Stop Hypertension. °What lifestyle changes can be made? °· Lose weight if you are overweight. Losing just 3?5% of your body weight can help prevent or control hypertension. °? For example, if your present  weight is 200 lb (91 kg), a loss of 3-5% of your weight means losing 6-10 lb (2.7-4.5 kg). °? Ask your health care provider to help you with a diet and exercise plan to safely lose weight. °· Get enough exercise. Do at least 150 minutes of moderate-intensity exercise each week. °? You could do this in short exercise sessions several times a day, or you could do longer exercise sessions a few times a week. For example, you could take a brisk 10-minute walk or bike ride, 3 times a day, for 5 days a week. °· Find ways to reduce stress, such as exercising, meditating, listening to music, or taking a yoga class. If you need help reducing stress, ask your health care provider. °· Do not smoke. This includes e-cigarettes. Chemicals in tobacco and nicotine products raise your blood pressure each time you smoke. If you need help quitting, ask your health care provider. °· Avoid alcohol. If you drink alcohol, limit alcohol intake to no more than 1 drink a day for nonpregnant women and 2 drinks a day for men. One drink equals 12 oz of beer, 5 oz of wine, or 1½ oz of hard liquor. °Why are these changes important? °Diet and lifestyle changes can help you prevent hypertension, and they may make you feel better overall and improve your quality of life. If you have hypertension, making these changes will help you control it and help prevent major complications, such as: °· Hardening and narrowing of arteries that supply blood to: °? Your heart. This can cause a heart   attack. °? Your brain. This can cause a stroke. °? Your kidneys. This can cause kidney failure. °· Stress on your heart muscle, which can cause heart failure. ° °What can I do to lower my risk? °· Work with your health care provider to make a hypertension prevention plan that works for you. Follow your plan and keep all follow-up visits as told by your health care provider. °· Learn how to check your blood pressure at home. Make sure that you know your personal target  blood pressure, as told by your health care provider. °How is this treated? °In addition to diet and lifestyle changes, your health care provider may recommend medicines to help lower your blood pressure. You may need to try a few different medicines to find what works best for you. You also may need to take more than one medicine. Take over-the-counter and prescription medicines only as told by your health care provider. °Where to find support: °Your health care provider can help you prevent hypertension and help you keep your blood pressure at a healthy level. Your local hospital or your community may also provide support services and prevention programs. °The American Heart Association offers an online support network at: http://supportnetwork.heart.org/high-blood-pressure °Where to find more information: °Learn more about hypertension from: °· National Heart, Lung, and Blood Institute: www.nhlbi.nih.gov/health/health-topics/topics/hbp °· Centers for Disease Control and Prevention: www.cdc.gov/bloodpressure °· American Academy of Family Physicians: http://familydoctor.org/familydoctor/en/diseases-conditions/high-blood-pressure.printerview.all.html ° °Learn more about the DASH diet from: °· National Heart, Lung, and Blood Institute: www.nhlbi.nih.gov/health/health-topics/topics/dash ° °Contact a health care provider if: °· You think you are having a reaction to medicines you have taken. °· You have recurrent headaches or feel dizzy. °· You have swelling in your ankles. °· You have trouble with your vision. °Summary °· Hypertension often does not cause any symptoms until blood pressure is very high. It is important to get your blood pressure checked regularly. °· Diet and lifestyle changes are the most important steps in preventing hypertension. °· By keeping your blood pressure in a healthy range, you can prevent complications like heart attack, heart failure, stroke, and kidney failure. °· Work with your health  care provider to make a hypertension prevention plan that works for you. °This information is not intended to replace advice given to you by your health care provider. Make sure you discuss any questions you have with your health care provider. °Document Released: 06/13/2015 Document Revised: 02/07/2016 Document Reviewed: 02/07/2016 °Elsevier Interactive Patient Education © 2018 Elsevier Inc. ° °

## 2018-03-19 ENCOUNTER — Encounter: Payer: Self-pay | Admitting: Gastroenterology

## 2018-03-19 ENCOUNTER — Ambulatory Visit (AMBULATORY_SURGERY_CENTER): Payer: BLUE CROSS/BLUE SHIELD | Admitting: Gastroenterology

## 2018-03-19 VITALS — BP 163/96 | HR 68 | Temp 97.5°F | Resp 11 | Ht 67.0 in | Wt 270.0 lb

## 2018-03-19 DIAGNOSIS — K635 Polyp of colon: Secondary | ICD-10-CM

## 2018-03-19 DIAGNOSIS — Z1211 Encounter for screening for malignant neoplasm of colon: Secondary | ICD-10-CM | POA: Diagnosis present

## 2018-03-19 DIAGNOSIS — D125 Benign neoplasm of sigmoid colon: Secondary | ICD-10-CM | POA: Diagnosis not present

## 2018-03-19 MED ORDER — SODIUM CHLORIDE 0.9 % IV SOLN: 500.0000 mL | Freq: Once | INTRAVENOUS | Status: DC

## 2018-03-19 NOTE — Patient Instructions (Signed)
INFORMATION ON POLYPS, FIBER DIET, HEMORRHOIDS. TRY MIRALAX OR OTHER FIBER ONCE PER DAY.  YOU HAD AN ENDOSCOPIC PROCEDURE TODAY AT [facility]:   Refer to the procedure report that was given to you for any specific questions about what was found during the examination.  If the procedure report does not answer your questions, please call your gastroenterologist to clarify.  If you requested that your care partner not be given the details of your procedure findings, then the procedure report has been included in a sealed envelope for you to review at your convenience later.  YOU SHOULD EXPECT: Some feelings of bloating in the abdomen. Passage of more gas than usual.  Walking can help get rid of the air that was put into your GI tract during the procedure and reduce the bloating. If you had a lower endoscopy (such as a colonoscopy or flexible sigmoidoscopy) you may notice spotting of blood in your stool or on the toilet paper. If you underwent a bowel prep for your procedure, you may not have a normal bowel movement for a few days.  Please Note:  You might notice some irritation and congestion in your nose or some drainage.  This is from the oxygen used during your procedure.  There is no need for concern and it should clear up in a day or so.  SYMPTOMS TO REPORT IMMEDIATELY:   Following lower endoscopy (colonoscopy or flexible sigmoidoscopy):  Excessive amounts of blood in the stool  Significant tenderness or worsening of abdominal pains  Swelling of the abdomen that is new, acute  Fever of 100F or higher  For urgent or emergent issues, a gastroenterologist can be reached at any hour by calling [phone].   DIET:  We do recommend a small meal at first, but then you may proceed to your regular diet.  Drink plenty of fluids but you should avoid alcoholic beverages for 24 hours.  ACTIVITY:  You should plan to take it easy for the rest of today and you should NOT DRIVE or use  heavy machinery until tomorrow (because of the sedation medicines used during the test).    FOLLOW UP: Our staff will call the number listed on your records the next business day following your procedure to check on you and address any questions or concerns that you may have regarding the information given to you following your procedure. If we do not reach you, we will leave a message.  However, if you are feeling well and you are not experiencing any problems, there is no need to return our call.  We will assume that you have returned to your regular daily activities without incident.  If any biopsies were taken you will be contacted by phone or by letter within the next 1-3 weeks.  Please call us at [phone] if you have not heard about the biopsies in 3 weeks.    SIGNATURES/CONFIDENTIALITY: You and/or your care partner have signed paperwork which will be entered into your electronic medical record.  These signatures attest to the fact that that the information above on your After Visit Summary has been reviewed and is understood.  Full responsibility of the confidentiality of this discharge information lies with you and/or your care-partner.

## 2018-03-19 NOTE — Progress Notes (Signed)
Pt's states no medical or surgical changes since previsit or office visit. 

## 2018-03-19 NOTE — Op Note (Signed)
[place] Patient Name: Patricia [name] Procedure Date: 03/19/2018 9:18 AM MRN: [id number] Endoscopist: Justice Britain , MD Age: 52 Referring MD:  Date of Birth: 09-14-1965 Gender: Female Account #: [id number] Procedure:                Colonoscopy Indications:              Screening for colorectal malignant neoplasm Medicines:                Monitored Anesthesia Care Procedure:                Pre-Anesthesia Assessment:                           - Prior to the procedure, a History and Physical                            was performed, and patient medications and                            allergies were reviewed. The patient's tolerance of                            previous anesthesia was also reviewed. The risks                            and benefits of the procedure and the sedation                            options and risks were discussed with the patient.                            All questions were answered, and informed consent                            was obtained. Prior Anticoagulants: The patient has                            taken no previous anticoagulant or antiplatelet                            agents. ASA Grade Assessment: II - A patient with                            mild systemic disease. After reviewing the risks                            and benefits, the patient was deemed in                            satisfactory condition to undergo the procedure.                           After obtaining informed consent, the colonoscope  was passed under direct vision. Throughout the                            procedure, the patient's blood pressure, pulse, and                            oxygen saturations were monitored continuously. The                            Colonoscope was introduced through the anus and                            advanced to the the cecum, identified by palpation.                            The  colonoscopy was performed without difficulty.                            The patient tolerated the procedure. The quality of                            the bowel preparation was evaluated using the BBPS                            [facility] Bowel Preparation Scale) with scores of:                            Right Colon = 2 (minor amount of residual staining,                            small fragments of stool and/or opaque liquid, but                            mucosa seen well), Transverse Colon = 3 (entire                            mucosa seen well with no residual staining, small                            fragments of stool or opaque liquid) and Left Colon                            = 2 (minor amount of residual staining, small                            fragments of stool and/or opaque liquid, but mucosa                            seen well). The total BBPS score equals 7. The                            quality of the bowel preparation was fair. Scope In:  9:32:44 AM Scope Out: 9:47:52 AM Scope Withdrawal Time: 0 hours 11 minutes 24 seconds  Total Procedure Duration: 0 hours 15 minutes 8 seconds  Findings:                 The digital rectal exam findings include                            non-thrombosed external hemorrhoids. Pertinent                            negatives include no palpable rectal lesions.                           The terminal ileum and ileocecal valve appeared                            normal.                           A 4 mm polyp was found in the sigmoid colon. The                            polyp was sessile. The polyp was removed with a                            cold snare. Resection and retrieval were complete.                           Many small and large-mouthed diverticula were found                            in the recto-sigmoid colon, sigmoid colon,                            descending colon, transverse colon and ascending                             colon. The predominance of the diverticulosis was                            on the left colon with infrequent ones in the [facility]                            and TC being noted.                           Normal mucosa was found in the entire colon                            otherwise.                           Non-bleeding non-thrombosed external and internal  hemorrhoids were found during retroflexion, during                            perianal exam and during digital exam. The                            hemorrhoids were Grade I (internal hemorrhoids that                            do not prolapse). Complications:            No immediate complications. Estimated Blood Loss:     Estimated blood loss was minimal. Impression:               - Preparation of the colon was fair.                           - Non-thrombosed external hemorrhoids found on                            digital rectal exam.                           - The examined portion of the ileum was normal.                           - One 4 mm polyp in the sigmoid colon, removed with                            a cold snare. Resected and retrieved.                           - Diverticulosis in the recto-sigmoid colon, in the                            sigmoid colon, in the descending colon, in the                            transverse colon and in the ascending colon                            (predominance in the left colon).                           - Normal mucosa in the entire examined colon                            otherwise.                           - Non-bleeding non-thrombosed external and internal                            hemorrhoids. Recommendation:           - The patient will be observed post-procedure,  until all discharge criteria are met.                           - Discharge patient to home.                           - Patient has a contact number available for                             emergencies. The signs and symptoms of potential                            delayed complications were discussed with the                            patient. Return to normal activities tomorrow.                            Written discharge instructions were provided to the                            patient.                           - High fiber diet.                           - Continue present medications.                           - Await pathology results.                           - Repeat colonoscopy in 5-10 years for surveillance                            based on pathology results if adenomatous tissue is                            found.                           - The findings and recommendations were discussed                            with the patient.                           - The findings and recommendations were discussed                            with the patient's family. Justice Britain, MD 03/19/2018 9:54:56 AM

## 2018-03-19 NOTE — Progress Notes (Signed)
Called to room to assist during endoscopic procedure.  Patient ID and intended procedure confirmed with present staff. Received instructions for my participation in the procedure from the performing physician.  

## 2018-03-19 NOTE — Progress Notes (Signed)
Report given to PACU, vss 

## 2018-03-20 ENCOUNTER — Telehealth: Payer: Self-pay | Admitting: *Deleted

## 2018-03-20 NOTE — Telephone Encounter (Signed)
  Follow up Call-  Call back number 03/19/2018  Post procedure Call Back phone  # [phone]  Permission to leave phone message Yes  Some recent data might be hidden     Patient questions:  Do you have a fever, pain , or abdominal swelling? No. Pain Score  0 *  Have you tolerated food without any problems? Yes.    Have you been able to return to your normal activities? Yes.    Do you have any questions about your discharge instructions: Diet   No. Medications  No. Follow up visit  No.  Do you have questions or concerns about your Care? No.  Actions: * If pain score is 4 or above: No action needed, pain <4.

## 2018-03-22 ENCOUNTER — Telehealth: Payer: Self-pay | Admitting: *Deleted

## 2018-03-22 ENCOUNTER — Encounter: Payer: Self-pay | Admitting: Gastroenterology

## 2018-03-22 NOTE — Telephone Encounter (Signed)
"  I was told I am scheduled for surgery on Friday, October 18.  I didn't scheduled this appointment."  You scheduled it when you were here on Nov 01, 2017.  "I didn't think I was scheduling the surgery.  I thought I was scheduling a follow-up appointment with Dr. Milinda Pointer."  Maybe there was some miscommunication.  "I can't do the surgery now.  I'll have to wait.  Can I do it late November or early December.  Dr. Milinda Pointer can do it on May 24, 2018.  "Okay, put me down for then."  Dr. Milinda Pointer will need to see you again prior to your surgery date.  Would you like for me to transfer you to a scheduler so you can make an appointment with him?  "Yes, that is fine."  I rescheduled the surgery date from 03/29/2018 to 05/24/2018 in [facility].

## 2018-03-25 NOTE — Telephone Encounter (Signed)
I will get postop appointments rescheduled.

## 2018-04-04 ENCOUNTER — Other Ambulatory Visit: Payer: BLUE CROSS/BLUE SHIELD

## 2018-04-25 ENCOUNTER — Other Ambulatory Visit: Payer: BLUE CROSS/BLUE SHIELD

## 2018-05-07 ENCOUNTER — Other Ambulatory Visit: Payer: BLUE CROSS/BLUE SHIELD

## 2018-05-14 ENCOUNTER — Ambulatory Visit: Payer: BLUE CROSS/BLUE SHIELD | Admitting: Podiatry

## 2018-05-14 ENCOUNTER — Encounter: Payer: Self-pay | Admitting: Podiatry

## 2018-05-14 DIAGNOSIS — L603 Nail dystrophy: Secondary | ICD-10-CM

## 2018-05-14 DIAGNOSIS — M205X2 Other deformities of toe(s) (acquired), left foot: Secondary | ICD-10-CM

## 2018-05-14 NOTE — Patient Instructions (Signed)
Pre-Operative Instructions  Congratulations, you have decided to take an important step towards improving your quality of life.  You can be assured that the doctors and staff at [facility] will be with you every step of the way.  Here are some important things you should know:  1. Plan to be at the surgery center/hospital at least 1 (one) hour prior to your scheduled time, unless otherwise directed by the surgical center/hospital staff.  You must have a responsible adult accompany you, remain during the surgery and drive you home.  Make sure you have directions to the surgical center/hospital to ensure you arrive on time. 2. If you are having surgery at [facility] or [facility], you will need a copy of your medical history and physical form from your family physician within one month prior to the date of surgery. We will give you a form for your primary physician to complete.  3. We make every effort to accommodate the date you request for surgery.  However, there are times where surgery dates or times have to be moved.  We will contact you as soon as possible if a change in schedule is required.   4. No aspirin/ibuprofen for one week before surgery.  If you are on aspirin, any non-steroidal anti-inflammatory medications (Mobic, Aleve, Ibuprofen) should not be taken seven (7) days prior to your surgery.  You make take Tylenol for pain prior to surgery.  5. Medications - If you are taking daily heart and blood pressure medications, seizure, reflux, allergy, asthma, anxiety, pain or diabetes medications, make sure you notify the surgery center/hospital before the day of surgery so they can tell you which medications you should take or avoid the day of surgery. 6. No food or drink after midnight the night before surgery unless directed otherwise by surgical center/hospital staff. 7. No alcoholic beverages 24-hours prior to surgery.  No smoking 24-hours prior or 24-hours after  surgery. 8. Wear loose pants or shorts. They should be loose enough to fit over bandages, boots, and casts. 9. Don't wear slip-on shoes. Sneakers are preferred. 10. Bring your boot with you to the surgery center/hospital.  Also bring crutches or a walker if your physician has prescribed it for you.  If you do not have this equipment, it will be provided for you after surgery. 11. If you have not been contacted by the surgery center/hospital by the day before your surgery, call to confirm the date and time of your surgery. 12. Leave-time from work may vary depending on the type of surgery you have.  Appropriate arrangements should be made prior to surgery with your employer. 13. Prescriptions will be provided immediately following surgery by your doctor.  Fill these as soon as possible after surgery and take the medication as directed. Pain medications will not be refilled on weekends and must be approved by the doctor. 14. Remove nail polish on the operative foot and avoid getting pedicures prior to surgery. 15. Wash the night before surgery.  The night before surgery wash the foot and leg well with water and the antibacterial soap provided. Be sure to pay special attention to beneath the toenails and in between the toes.  Wash for at least three (3) minutes. Rinse thoroughly with water and dry well with a towel.  Perform this wash unless told not to do so by your physician.  Enclosed: 1 Ice pack (please put in freezer the night before surgery)   1 Hibiclens skin cleaner     Pre-op instructions  If you have any questions regarding the instructions, please do not hesitate to call our office.  [place]: [address], [place], [place] [address] -- [phone]  [place]: [address] [place], [place] [address] -- [phone]  [place]: [address]  [place], [place] [address] -- [phone]  [place]: [address], [address], [place],  [address] -- [phone]  Website:  [email or web address] 

## 2018-05-14 NOTE — Progress Notes (Signed)
She presents today for reassigning of her medication and consent form.  At this point she is still having pain to the first metatarsophalangeal joint of the left foot she is also having pain to the toenail of the hallux left.  She denies any changes in her past medical history reminding me that she does have a history of blood dyscrasia where she had blood clots in the past for no reason whatsoever they were idiopathic states that she was on Coumadin for 6 months.  Does relate family history of clotting.  Objective: Vital signs are stable she is alert and oriented x3 pulses are palpable left lower extremity nails are thick yellow dystrophic and clinically mycotic she has pain on palpation with obvious bunion deformity first metatarsal phalangeal joint of the left foot.  Pain on range of motion.  Assessment clotting disorder.  Hallux valgus disorder with hallux limitus.  And nail dystrophy.  Plan: Discussed in great detail today reiterated he her consent form discussing it in total.  Also took samples of the toenails today for pathologic evaluation explained to her that I wanted her to go ahead and start on a baby aspirin a day we will continue the baby aspirin through surgery.  She understands that is amenable to it we will place her on Xarelto postoperatively.  Samples of the nails were taken today for pathologic evaluation.

## 2018-05-21 ENCOUNTER — Telehealth: Payer: Self-pay | Admitting: *Deleted

## 2018-05-21 ENCOUNTER — Other Ambulatory Visit: Payer: Self-pay | Admitting: Podiatry

## 2018-05-21 MED ORDER — CEPHALEXIN 500 MG PO CAPS: 500.0000 mg | ORAL_CAPSULE | Freq: Three times a day (TID) | ORAL | 0 refills | Status: DC

## 2018-05-21 MED ORDER — OXYCODONE-ACETAMINOPHEN 10-325 MG PO TABS: 1.0000 | ORAL_TABLET | Freq: Four times a day (QID) | ORAL | 0 refills | Status: AC | PRN

## 2018-05-21 MED ORDER — ENOXAPARIN SODIUM 40 MG/0.4ML ~~LOC~~ SOLN: 40.0000 mg | SUBCUTANEOUS | 0 refills | Status: DC

## 2018-05-21 MED ORDER — ONDANSETRON HCL 4 MG PO TABS: 4.0000 mg | ORAL_TABLET | Freq: Three times a day (TID) | ORAL | 0 refills | Status: DC | PRN

## 2018-05-21 NOTE — Telephone Encounter (Signed)
Dr. Milinda Pointer states he ordered Lovenox for pt to take to the surgery center, to receive instructions from their surgery staff on the use, and refills along with post-op medications will be given after the surgery. I informed pt of Dr. Stephenie Acres orders, pt states understanding.

## 2018-05-21 NOTE — Telephone Encounter (Signed)
-----   Message from Garrel Ridgel, [place] sent at 05/20/2018  7:04 AM EST ----- Positive for fungus.  She already has appointment.

## 2018-05-22 ENCOUNTER — Telehealth: Payer: Self-pay | Admitting: Podiatry

## 2018-05-22 NOTE — Telephone Encounter (Signed)
Caren Griffins with the surgical center called saying pt is scheduled for this Friday, 13 December but she has down that her surgery is Thursday 19 December. Caren Griffins stated Dr. Milinda Pointer does not have any time blocked off for that day. Requested a call back.

## 2018-05-27 ENCOUNTER — Encounter: Payer: Self-pay | Admitting: Podiatry

## 2018-05-27 ENCOUNTER — Other Ambulatory Visit: Payer: Self-pay | Admitting: Podiatry

## 2018-05-27 DIAGNOSIS — M2012 Hallux valgus (acquired), left foot: Secondary | ICD-10-CM

## 2018-05-27 MED ORDER — ONDANSETRON HCL 4 MG PO TABS: 4.0000 mg | ORAL_TABLET | Freq: Three times a day (TID) | ORAL | 0 refills | Status: DC | PRN

## 2018-05-27 MED ORDER — OXYCODONE-ACETAMINOPHEN 10-325 MG PO TABS: 1.0000 | ORAL_TABLET | Freq: Four times a day (QID) | ORAL | 0 refills | Status: DC | PRN

## 2018-05-27 MED ORDER — CEPHALEXIN 500 MG PO CAPS: 500.0000 mg | ORAL_CAPSULE | Freq: Three times a day (TID) | ORAL | 0 refills | Status: DC

## 2018-05-28 ENCOUNTER — Telehealth: Payer: Self-pay | Admitting: Podiatry

## 2018-05-28 NOTE — Telephone Encounter (Signed)
Pt had bunion surgery yesterday and has some questions for the nurse. Should pt be sleeping with boot on? Foot feels tight. Please give pt a call back.

## 2018-05-28 NOTE — Telephone Encounter (Signed)
I called pt, she asked if she could take the boot off when resting, but must have the boot on to get up and around and to sleep. Pt asked how to ice, the nurse at the surgery center said to place at the back of the leg, below the knee crease. I told her the circulation would take the cooled blood down to the foot and help decrease pain and swelling. Pt states her toes still feel numb and I told her that was not unusual for up to 72 hours and she could take off the boot, open ended sock, and ace and elevate, that would change the sensation in the foot and may make more comfortable. I told pt that after 15 minutes with the ace off elevated, or if she had discomfort with the ace off dangle for 15 minute, either way after 15 minutes put foot level with the hip and rewrap the ace starting at the toes, rolling a single layer up the leg, reapply sock and boot.  Pt's husband asked how long could pt be out of the boot. I told him she had to be in it to sleep and if she was mobile, so just resting could have the boot off about 45 minutes. Pt and husband discussed benefits of remaining in the boot and I listened. Pt states she will be in the boot most of the time.

## 2018-05-30 ENCOUNTER — Ambulatory Visit (INDEPENDENT_AMBULATORY_CARE_PROVIDER_SITE_OTHER): Payer: BLUE CROSS/BLUE SHIELD

## 2018-05-30 ENCOUNTER — Ambulatory Visit (INDEPENDENT_AMBULATORY_CARE_PROVIDER_SITE_OTHER): Payer: BLUE CROSS/BLUE SHIELD | Admitting: Podiatry

## 2018-05-30 VITALS — Temp 98.4°F

## 2018-05-30 DIAGNOSIS — M205X2 Other deformities of toe(s) (acquired), left foot: Secondary | ICD-10-CM | POA: Diagnosis not present

## 2018-05-30 MED ORDER — OXYCODONE-ACETAMINOPHEN 10-325 MG PO TABS: 1.0000 | ORAL_TABLET | Freq: Four times a day (QID) | ORAL | 0 refills | Status: AC | PRN

## 2018-06-01 NOTE — Progress Notes (Signed)
She presents today date of surgery 05/27/2018 status post [facility] bunionectomy left.  States my foot is hurting pretty bad because I did not take a pain pill.  She continues to take her blood thinner and have no problems with that.  She is currently taking Lovenox injectable.  She denies fever chills nausea muscle aches pain chest pain back pain chest headache.  Objective: Vital signs are stable alert and oriented x3.  Pulses are palpable.  She has swollen foot no dehiscence tender on range of motion of the first metatarsal phalangeal joint.  Radiographs demonstrate capital osteotomy in good position with internal fixation in good position.  Assessment: Well-healing surgical foot.  Plan: Redressed today dressed a compressive dressing encouraged her to continue range of motion exercises not to walk without the cam walker and continue her pain medication as directed.  We refilled her pain medication today for her.

## 2018-06-13 ENCOUNTER — Ambulatory Visit (INDEPENDENT_AMBULATORY_CARE_PROVIDER_SITE_OTHER): Payer: BLUE CROSS/BLUE SHIELD | Admitting: Podiatry

## 2018-06-13 DIAGNOSIS — M205X2 Other deformities of toe(s) (acquired), left foot: Secondary | ICD-10-CM

## 2018-06-13 DIAGNOSIS — Z9889 Other specified postprocedural states: Secondary | ICD-10-CM

## 2018-06-13 DIAGNOSIS — B351 Tinea unguium: Secondary | ICD-10-CM

## 2018-06-13 MED ORDER — TERBINAFINE HCL 250 MG PO TABS: 250.0000 mg | ORAL_TABLET | Freq: Every day | ORAL | 0 refills | Status: DC

## 2018-06-14 LAB — HEPATIC FUNCTION PANEL
AG RATIO: 1.4 (calc) (ref 1.0–2.5)
ALT: 22 U/L (ref 6–29)
AST: 16 U/L (ref 10–35)
Albumin: 3.8 g/dL (ref 3.6–5.1)
Alkaline phosphatase (APISO): 73 U/L (ref 33–130)
Bilirubin, Direct: 0.1 mg/dL (ref 0.0–0.2)
Globulin: 2.7 g/dL (calc) (ref 1.9–3.7)
Indirect Bilirubin: 0.3 mg/dL (calc) (ref 0.2–1.2)
Total Bilirubin: 0.4 mg/dL (ref 0.2–1.2)
Total Protein: 6.5 g/dL (ref 6.1–8.1)

## 2018-06-23 NOTE — Progress Notes (Signed)
  Subjective:  Patient ID: Patricia [name], female    DOB: 22-Oct-1965,  MRN: [id number]  Chief Complaint  Patient presents with  . Routine Post [facility] 12.16.2019 [place] Bunionectomy [facility]   "Its feeling much better than it was last week"  . Suture / Staple Removal    Suture ends removed today    DOS: 05/27/18 Procedure: [place] buniontectomy  53 y.o. female returns for post-op check.  Would also like to discuss toenail fungus today would like to proceed with treatment.  Review of Systems: Negative except as noted in the HPI. Denies N/V/F/Ch.  Past Medical History:  Diagnosis Date  . Clotting disorder ([place])   . Hypertension     Current Outpatient Medications:  .  enoxaparin (LOVENOX) 40 MG/0.4ML injection, Inject 0.4 mLs (40 mg total) into the skin daily for 14 days., Disp: 14 Syringe, Rfl: 0 .  glucosamine-chondroitin 500-400 MG tablet, Take 1 tablet by mouth 3 (three) times daily., Disp: , Rfl:  .  Multiple Vitamin (MULTIVITAMIN) tablet, Take 1 tablet by mouth daily., Disp: , Rfl:  .  ondansetron (ZOFRAN) 4 MG tablet, Take 1 tablet (4 mg total) by mouth every 8 (eight) hours as needed for nausea or vomiting., Disp: 20 tablet, Rfl: 0 .  OVER THE COUNTER MEDICATION, Fish oil, 1000 mg one capsule daily., Disp: , Rfl:  .  terbinafine (LAMISIL) 250 MG tablet, Take 1 tablet (250 mg total) by mouth daily., Disp: 30 tablet, Rfl: 0  Current Facility-Administered Medications:  .  0.9 %  sodium chloride infusion, 500 mL, Intravenous, Once, Mansouraty, Telford Nab., MD  Social History   Tobacco Use  Smoking Status Current Some Day Smoker  . Packs/day: 0.30  . Types: Cigarettes  Smokeless Tobacco Never Used  Tobacco Comment   using electronic cigarettes too    No Known Allergies Objective:  There were no vitals filed for this visit. There is no height or weight on file to calculate BMI. Constitutional Well developed. Well nourished.  Vascular Foot warm and well  perfused. Capillary refill normal to all digits.   Neurologic Normal speech. Oriented to person, place, and time. Epicritic sensation to light touch grossly present bilaterally.  Dermatologic Skin healing well without signs of infection. Skin edges well coapted without signs of infection. Nails thickened and dystrophic.  Orthopedic: Tenderness to palpation noted about the surgical site.   Radiographs: None Assessment:   1. Hallux limitus, left   2. Status post left foot surgery   3. Onychomycosis    Plan:  Patient was evaluated and treated and all questions answered.  S/p foot surgery left -Progressing as expected post-operatively. -XR: none -WB Status: WBAt in shoe  -Sutures: ends cut. -Medications: No post-op meds refilled. -Foot redressed.  Onychomycosis -Educated on etiology of nail fungus. -Baseline liver function studies ordered. Will d/c terbinafine if elevated during therapy. -eRx for oral terbinafine #30. Educated on risks and benefits of the medication.  Return in about 2 weeks (around 06/27/2018) for hyatt patient, POV w/ xray, left foot, lamisil f/u.

## 2018-06-27 ENCOUNTER — Ambulatory Visit (INDEPENDENT_AMBULATORY_CARE_PROVIDER_SITE_OTHER): Payer: BLUE CROSS/BLUE SHIELD

## 2018-06-27 ENCOUNTER — Ambulatory Visit (INDEPENDENT_AMBULATORY_CARE_PROVIDER_SITE_OTHER): Payer: Self-pay | Admitting: Podiatry

## 2018-06-27 ENCOUNTER — Encounter: Payer: Self-pay | Admitting: Podiatry

## 2018-06-27 DIAGNOSIS — M205X2 Other deformities of toe(s) (acquired), left foot: Secondary | ICD-10-CM | POA: Diagnosis not present

## 2018-06-27 DIAGNOSIS — Z9889 Other specified postprocedural states: Secondary | ICD-10-CM

## 2018-06-27 NOTE — Progress Notes (Signed)
She presents today states that she is doing okay foot is feeling pretty good as long she does not wear the shoe.  She states that the shoe bothers her right in here she points to the medial longitudinal arch area her left foot.  Date of surgery 05/27/2018.  She denies fever chills nausea vomiting muscle aches pain states she is ready go back to work.  Objective: Vital signs are stable alert and oriented x3.  Pulses are palpable.  Sutures appear to be healing very nicely she is got good range of motion of the first metatarsophalangeal joint minimal edema no erythema cellulitis drainage or odor radiographs demonstrate capital fragment in good position with internal fixation in good position.  Assessment: Well-healing surgical foot.  Plan: Follow-up with her in 2 to 4 weeks.

## 2018-07-11 ENCOUNTER — Ambulatory Visit (INDEPENDENT_AMBULATORY_CARE_PROVIDER_SITE_OTHER): Payer: Self-pay | Admitting: Podiatry

## 2018-07-11 ENCOUNTER — Ambulatory Visit (INDEPENDENT_AMBULATORY_CARE_PROVIDER_SITE_OTHER): Payer: BLUE CROSS/BLUE SHIELD

## 2018-07-11 DIAGNOSIS — Z9889 Other specified postprocedural states: Secondary | ICD-10-CM

## 2018-07-11 DIAGNOSIS — M205X2 Other deformities of toe(s) (acquired), left foot: Secondary | ICD-10-CM | POA: Diagnosis not present

## 2018-07-11 NOTE — Progress Notes (Signed)
She presents today for follow-up of her Patricia [name] bunionectomy left states that my foot is feeling much much better and the swelling is going down.  Very happy with the outcome thus far.  Objective: Vital signs are stable alert and oriented x3.  Pulses are palpable.  She has great range of motion of the first metatarsal phalangeal joint radiographs demonstrate well healing surgical foot capital osteotomy is in good position with internal fixation in good position.  Assessment: Well-healing surgical foot.  Plan: At this point I am going to encourage her to continue to wear the [place] shoe but I want her into a regular shoe over the next 2 to 3 weeks and I will follow-up with her in 1 month.

## 2018-07-23 ENCOUNTER — Other Ambulatory Visit: Payer: Self-pay | Admitting: Family Medicine

## 2018-07-23 DIAGNOSIS — Z1231 Encounter for screening mammogram for malignant neoplasm of breast: Secondary | ICD-10-CM

## 2018-08-13 ENCOUNTER — Encounter: Payer: Self-pay | Admitting: Podiatry

## 2018-08-13 ENCOUNTER — Ambulatory Visit (INDEPENDENT_AMBULATORY_CARE_PROVIDER_SITE_OTHER): Payer: BLUE CROSS/BLUE SHIELD

## 2018-08-13 ENCOUNTER — Ambulatory Visit (INDEPENDENT_AMBULATORY_CARE_PROVIDER_SITE_OTHER): Payer: BLUE CROSS/BLUE SHIELD | Admitting: Podiatry

## 2018-08-13 DIAGNOSIS — M205X2 Other deformities of toe(s) (acquired), left foot: Secondary | ICD-10-CM

## 2018-08-13 DIAGNOSIS — Z9889 Other specified postprocedural states: Secondary | ICD-10-CM

## 2018-08-13 NOTE — Progress Notes (Signed)
She presents today for postop visit date of surgery 05/27/2018 that is post [facility] bunionectomy left.  States that is still swollen some but all in all it seems to be doing fine.  Objective: Vital signs are stable alert and oriented x3.  There is no erythematous mild edema no cellulitis drainage odor she has good range of motion without crepitation or pain.  Radiographs taken today demonstrate a well-healing osteotomy with internal fixation in good position.  Assessment: Well-healing surgical foot.  Plan: Follow-up with me in 6 weeks if necessary.

## 2018-08-19 ENCOUNTER — Ambulatory Visit
Admission: RE | Admit: 2018-08-19 | Discharge: 2018-08-19 | Disposition: A | Discharge status: Home / Self Care | Payer: BLUE CROSS/BLUE SHIELD | Source: Ambulatory Visit | Attending: Family Medicine | Admitting: Family Medicine

## 2018-08-19 DIAGNOSIS — Z1231 Encounter for screening mammogram for malignant neoplasm of breast: Secondary | ICD-10-CM

## 2018-09-25 ENCOUNTER — Encounter: Payer: Self-pay | Admitting: Family Medicine

## 2018-09-27 ENCOUNTER — Other Ambulatory Visit: Payer: Self-pay

## 2018-09-27 ENCOUNTER — Encounter: Payer: Self-pay | Admitting: Family Medicine

## 2018-09-27 ENCOUNTER — Ambulatory Visit (INDEPENDENT_AMBULATORY_CARE_PROVIDER_SITE_OTHER): Payer: BLUE CROSS/BLUE SHIELD | Admitting: Family Medicine

## 2018-09-27 DIAGNOSIS — F17210 Nicotine dependence, cigarettes, uncomplicated: Secondary | ICD-10-CM | POA: Diagnosis not present

## 2018-09-27 MED ORDER — VARENICLINE TARTRATE 0.5 MG X 11 & 1 MG X 42 PO MISC: ORAL | 0 refills | Status: DC

## 2018-09-27 NOTE — Progress Notes (Signed)
Virtual Visit via Video Note  I connected with Patricia [name] on 09/27/18 at 11:00 AM EDT by a video enabled telemedicine application and verified that I am speaking with the correct person using two identifiers.  Location patient: home Location provider:work or home office Persons participating in the virtual visit: patient, provider  I discussed the limitations of evaluation and management by telemedicine and the availability of in person appointments. The patient expressed understanding and agreed to proceed.   HPI: Pt and her husband interested in quitting smoking given the COVID-19 pandemic.  Prior to [place] 1/2 ppd, but now more.  Trying to find other coping mechanisms, started a garden.  Notes increased stress and anxiety as worried about her parents who live in [place].  Pt also notes her sister is a Marine scientist.  Pt states chantix worked well in the past for her.  Pt is focused on changing her habits to help her be successful at quitting.  Now having a glass of water each am instead of coffee, walking in the evening with her husband, etc.  ROS: See pertinent positives and negatives per HPI.  Past Medical History:  Diagnosis Date  . Clotting disorder ([place])   . Hypertension     Past Surgical History:  Procedure Laterality Date  . ABDOMINAL HYSTERECTOMY    . BREAST REDUCTION SURGERY    . KNEE SURGERY Left   . REDUCTION MAMMAPLASTY Bilateral     Family History  Problem Relation Age of Onset  . Deep vein thrombosis Other   . Clotting disorder Mother   . Clotting disorder Sister   . Clotting disorder Brother   . Clotting disorder Maternal Uncle   . Colon cancer Neg Hx   . Esophageal cancer Neg Hx   . Rectal cancer Neg Hx   . Stomach cancer Neg Hx     SOCIAL HX:    Current Outpatient Medications:  .  enoxaparin (LOVENOX) 40 MG/0.4ML injection, Inject 0.4 mLs (40 mg total) into the skin daily for 14 days., Disp: 14 Syringe, Rfl: 0 .  glucosamine-chondroitin 500-400  MG tablet, Take 1 tablet by mouth 3 (three) times daily., Disp: , Rfl:  .  Multiple Vitamin (MULTIVITAMIN) tablet, Take 1 tablet by mouth daily., Disp: , Rfl:  .  ondansetron (ZOFRAN) 4 MG tablet, Take 1 tablet (4 mg total) by mouth every 8 (eight) hours as needed for nausea or vomiting., Disp: 20 tablet, Rfl: 0 .  OVER THE COUNTER MEDICATION, Fish oil, 1000 mg one capsule daily., Disp: , Rfl:  .  terbinafine (LAMISIL) 250 MG tablet, Take 1 tablet (250 mg total) by mouth daily., Disp: 30 tablet, Rfl: 0  Current Facility-Administered Medications:  .  0.9 %  sodium chloride infusion, 500 mL, Intravenous, Once, Mansouraty, Telford Nab., MD  EXAMTonette Bihari per patient if applicable:  GENERAL: alert, oriented, appears well and in no acute distress  HEENT: atraumatic, conjunctiva clear, no obvious abnormalities on inspection of external nose and ears  NECK: normal movements of the head and neck  LUNGS: on inspection no signs of respiratory distress, breathing rate appears normal, no obvious gross SOB, gasping or wheezing  CV: no obvious cyanosis  MS: moves all visible extremities without noticeable abnormality  PSYCH/NEURO: pleasant and cooperative, no obvious depression or anxiety, speech and thought processing grossly intact  ASSESSMENT AND PLAN:  Discussed the following assessment and plan:  Cigarette nicotine dependence without complication  -smoking cessation counseling > 54min, <10 min -discussed various ways  to help quit. -given info including 1-800-QUITNOW -will start chantix  - Plan: varenicline (CHANTIX PAK) 0.5 MG X 11 & 1 MG X 42 tablet -f/u prn in the next month or so.  Pt to call office for continuation pack of chantix.  F/u prn   I discussed the assessment and treatment plan with the patient. The patient was provided an opportunity to ask questions and all were answered. The patient agreed with the plan and demonstrated an understanding of the instructions.   The  patient was advised to call back or seek an in-person evaluation if the symptoms worsen or if the condition fails to improve as anticipated.   Billie Ruddy, MD

## 2018-10-01 ENCOUNTER — Ambulatory Visit: Payer: BLUE CROSS/BLUE SHIELD | Admitting: Podiatry

## 2018-11-03 ENCOUNTER — Other Ambulatory Visit: Payer: Self-pay | Admitting: Family Medicine

## 2018-11-03 DIAGNOSIS — F17210 Nicotine dependence, cigarettes, uncomplicated: Secondary | ICD-10-CM

## 2019-03-08 ENCOUNTER — Other Ambulatory Visit: Payer: Self-pay

## 2019-03-08 ENCOUNTER — Ambulatory Visit (INDEPENDENT_AMBULATORY_CARE_PROVIDER_SITE_OTHER): Payer: BC Managed Care – PPO

## 2019-03-08 DIAGNOSIS — Z23 Encounter for immunization: Secondary | ICD-10-CM

## 2019-08-05 ENCOUNTER — Other Ambulatory Visit: Payer: Self-pay

## 2019-08-05 ENCOUNTER — Ambulatory Visit (INDEPENDENT_AMBULATORY_CARE_PROVIDER_SITE_OTHER)
Admission: RE | Admit: 2019-08-05 | Discharge: 2019-08-05 | Disposition: A | Discharge status: Home / Self Care | Payer: BC Managed Care – PPO | Source: Ambulatory Visit

## 2019-08-05 ENCOUNTER — Ambulatory Visit (HOSPITAL_COMMUNITY)
Admission: RE | Admit: 2019-08-05 | Discharge: 2019-08-05 | Disposition: A | Discharge status: Home / Self Care | Payer: BC Managed Care – PPO | Source: Ambulatory Visit | Attending: Family Medicine | Admitting: Family Medicine

## 2019-08-05 DIAGNOSIS — M79662 Pain in left lower leg: Secondary | ICD-10-CM | POA: Diagnosis not present

## 2019-08-05 DIAGNOSIS — M79605 Pain in left leg: Secondary | ICD-10-CM

## 2019-08-05 DIAGNOSIS — Z86718 Personal history of other venous thrombosis and embolism: Secondary | ICD-10-CM | POA: Insufficient documentation

## 2019-08-05 DIAGNOSIS — Z86711 Personal history of pulmonary embolism: Secondary | ICD-10-CM | POA: Insufficient documentation

## 2019-08-05 DIAGNOSIS — M7989 Other specified soft tissue disorders: Secondary | ICD-10-CM

## 2019-08-05 DIAGNOSIS — R52 Pain, unspecified: Secondary | ICD-10-CM | POA: Diagnosis not present

## 2019-08-05 NOTE — Discharge Instructions (Signed)
Go for an ultrasound to rule out blood clot 4:30 main entrance

## 2019-08-05 NOTE — ED Provider Notes (Signed)
Virtual Visit via Video Note:  Patricia [name]  initiated request for Telemedicine visit with [facility] Urgent [facility] team. I connected with Patricia [name]  on 08/05/2019 at 12:24 PM  for a synchronized telemedicine visit using a video enabled HIPPA compliant telemedicine application. I verified that I am speaking with Patricia [name]  using two identifiers. Orvan July, NP  was physically located in a [facility] Urgent care site and TEAGUE POSTEN was located at a different location.   The limitations of evaluation and management by telemedicine as well as the availability of in-person appointments were discussed. Patient was informed that she  may incur a bill ( including co-pay) for this virtual visit encounter. Patricia [name]  expressed understanding and gave verbal consent to proceed with virtual visit.     History of Present Illness:Patricia [name]  is a 54 y.o. female presents with pain to left posterior knee extending into left calf.  Symptoms have been constant since yesterday.  Concern for possible DVT based on history of DVT and PE.  Currently not taking any blood thinners. Denies any injuries to the leg, fever, chills, body aches. Has past medical history of clotting disorder and hypertension. Denies any specific swelling or erythema to the area Denies any chest pain or shortness of breath.   Past Medical History:  Diagnosis Date  . Clotting disorder ([place])   . Hypertension     No Known Allergies      Observations/Objective: VITALS: Per patient if applicable, see vitals. GENERAL: Alert, appears well and in no acute distress. HEENT: Atraumatic, conjunctiva clear, no obvious abnormalities on inspection of external nose and ears. NECK: Normal movements of the head and neck. CARDIOPULMONARY: No increased WOB. Speaking in clear sentences. I:E ratio WNL.  MS: Moves all visible extremities without noticeable abnormality. PSYCH: Pleasant and cooperative,  well-groomed. Speech normal rate and rhythm. Affect is appropriate. Insight and judgement are appropriate. Attention is focused, linear, and appropriate.  NEURO: CN grossly intact. Oriented as arrived to appointment on time with no prompting. Moves both UE equally.  SKIN: No obvious lesions, wounds, erythema, or cyanosis noted on face or hands.     Assessment and Plan: Lower extremity pain with history of DVT Standing to [facility] outpatient for ultrasound and DVT rule out  Follow Up Instructions: Go to [facility] at 430 for your appointment for ultrasound to rule out DVT.    I discussed the assessment and treatment plan with the patient. The patient was provided an opportunity to ask questions and all were answered. The patient agreed with the plan and demonstrated an understanding of the instructions.   The patient was advised to call back or seek an in-person evaluation if the symptoms worsen or if the condition fails to improve as anticipated.    Orvan July, NP  08/05/2019 12:24 PM         Orvan July, NP 08/05/19 1350

## 2019-08-05 NOTE — Progress Notes (Signed)
Left lower extremity venous duplex exam completed.  Preliminary results can be found under CV proc under chart review.  08/05/2019 4:56 PM  , K., RDMS, RVT

## 2019-12-12 ENCOUNTER — Other Ambulatory Visit: Payer: Self-pay | Admitting: Family Medicine

## 2019-12-12 ENCOUNTER — Other Ambulatory Visit: Payer: Self-pay

## 2019-12-12 ENCOUNTER — Ambulatory Visit: Payer: BC Managed Care – PPO | Admitting: Family Medicine

## 2019-12-12 ENCOUNTER — Encounter: Payer: Self-pay | Admitting: Family Medicine

## 2019-12-12 VITALS — BP 132/90 | HR 68 | Temp 97.7°F | Wt 282.0 lb

## 2019-12-12 DIAGNOSIS — I10 Essential (primary) hypertension: Secondary | ICD-10-CM | POA: Diagnosis not present

## 2019-12-12 DIAGNOSIS — R600 Localized edema: Secondary | ICD-10-CM

## 2019-12-12 DIAGNOSIS — F17210 Nicotine dependence, cigarettes, uncomplicated: Secondary | ICD-10-CM | POA: Diagnosis not present

## 2019-12-12 LAB — COMPREHENSIVE METABOLIC PANEL
ALT: 17 U/L (ref 0–35)
AST: 13 U/L (ref 0–37)
Albumin: 3.9 g/dL (ref 3.5–5.2)
Alkaline Phosphatase: 73 U/L (ref 39–117)
BUN: 13 mg/dL (ref 6–23)
CO2: 28 mEq/L (ref 19–32)
Calcium: 9.4 mg/dL (ref 8.4–10.5)
Chloride: 106 mEq/L (ref 96–112)
Creatinine, Ser: 0.78 mg/dL (ref 0.40–1.20)
GFR: 93.53 mL/min (ref >60.00)
Glucose, Bld: 90 mg/dL (ref 70–99)
Potassium: 4.4 mEq/L (ref 3.5–5.1)
Sodium: 140 mEq/L (ref 135–145)
Total Bilirubin: 0.3 mg/dL (ref 0.2–1.2)
Total Protein: 6.3 g/dL (ref 6.0–8.3)

## 2019-12-12 LAB — CBC
HCT: 40.4 % (ref 36.0–46.0)
Hemoglobin: 13.4 g/dL (ref 12.0–15.0)
MCHC: 33.3 g/dL (ref 30.0–36.0)
MCV: 92.3 fl (ref 78.0–100.0)
Platelets: 319 10*3/uL (ref 150.0–400.0)
RBC: 4.37 Mil/uL (ref 3.87–5.11)
RDW: 13.9 % (ref 11.5–15.5)
WBC: 7.5 10*3/uL (ref 4.0–10.5)

## 2019-12-12 LAB — TSH: TSH: 1.26 u[IU]/mL (ref 0.35–4.50)

## 2019-12-12 LAB — BRAIN NATRIURETIC PEPTIDE: Pro B Natriuretic peptide (BNP): 21 pg/mL (ref 0.0–100.0)

## 2019-12-12 LAB — T4, FREE: Free T4: 0.85 ng/dL (ref 0.60–1.60)

## 2019-12-12 MED ORDER — NICOTINE POLACRILEX 4 MG MT GUM: 4.0000 mg | CHEWING_GUM | OROMUCOSAL | 3 refills | Status: DC | PRN

## 2019-12-12 MED ORDER — POTASSIUM CHLORIDE CRYS ER 20 MEQ PO TBCR: 20.0000 meq | EXTENDED_RELEASE_TABLET | Freq: Every day | ORAL | 1 refills | Status: DC

## 2019-12-12 MED ORDER — CHLORTHALIDONE 25 MG PO TABS: 25.0000 mg | ORAL_TABLET | Freq: Every day | ORAL | 1 refills | Status: DC

## 2019-12-12 NOTE — Patient Instructions (Signed)
Peripheral Edema  Peripheral edema is swelling that is caused by a buildup of fluid. Peripheral edema most often affects the lower legs, ankles, and feet. It can also develop in the arms, hands, and face. The area of the body that has peripheral edema will look swollen. It may also feel heavy or warm. Your clothes may start to feel tight. Pressing on the area may make a temporary dent in your skin. You may not be able to move your swollen arm or leg as much as usual. There are many causes of peripheral edema. It can happen because of a complication of other conditions such as congestive heart failure, kidney disease, or a problem with your blood circulation. It also can be a side effect of certain medicines or because of an infection. It often happens to women during pregnancy. Sometimes, the cause is not known. Follow these instructions at home: Managing pain, stiffness, and swelling   Raise (elevate) your legs while you are sitting or lying down.  Move around often to prevent stiffness and to lessen swelling.  Do not sit or stand for long periods of time.  Wear support stockings as told by your health care provider. Medicines  Take over-the-counter and prescription medicines only as told by your health care provider.  Your health care provider may prescribe medicine to help your body get rid of excess water (diuretic). General instructions  Pay attention to any changes in your symptoms.  Follow instructions from your health care provider about limiting salt (sodium) in your diet. Sometimes, eating less salt may reduce swelling.  Moisturize skin daily to help prevent skin from cracking and draining.  Keep all follow-up visits as told by your health care provider. This is important. Contact a health care provider if you have:  A fever.  Edema that starts suddenly or is getting worse, especially if you are pregnant or have a medical condition.  Swelling in only one leg.  Increased  swelling, redness, or pain in one or both of your legs.  Drainage or sores at the area where you have edema. Get help right away if you:  Develop shortness of breath, especially when you are lying down.  Have pain in your chest or abdomen.  Feel weak.  Feel faint. Summary  Peripheral edema is swelling that is caused by a buildup of fluid. Peripheral edema most often affects the lower legs, ankles, and feet.  Move around often to prevent stiffness and to lessen swelling. Do not sit or stand for long periods of time.  Pay attention to any changes in your symptoms.  Contact a health care provider if you have edema that starts suddenly or is getting worse, especially if you are pregnant or have a medical condition.  Get help right away if you develop shortness of breath, especially when lying down. This information is not intended to replace advice given to you by your health care provider. Make sure you discuss any questions you have with your health care provider. Document Revised: 02/20/2018 Document Reviewed: 02/20/2018 Elsevier Patient Education  2020 [employer].  Steps to Quit Smoking Smoking tobacco is the leading cause of preventable death. It can affect almost every organ in the body. Smoking puts you and those around you at risk for developing many serious chronic diseases. Quitting smoking can be difficult, but it is one of the best things that you can do for your health. It is never too late to quit. How do I get ready to  quit? When you decide to quit smoking, create a plan to help you succeed. Before you quit:  Pick a date to quit. Set a date within the next 2 weeks to give you time to prepare.  Write down the reasons why you are quitting. Keep this list in places where you will see it often.  Tell your family, friends, and co-workers that you are quitting. Support from your loved ones can make quitting easier.  Talk with your health care provider about your options  for quitting smoking.  Find out what treatment options are covered by your health insurance.  Identify people, places, things, and activities that make you want to smoke (triggers). Avoid them. What first steps can I take to quit smoking?  Throw away all cigarettes at home, at work, and in your car.  Throw away smoking accessories, such as Scientist, research (medical).  Clean your car. Make sure to empty the ashtray.  Clean your home, including curtains and carpets. What strategies can I use to quit smoking? Talk with your health care provider about combining strategies, such as taking medicines while you are also receiving in-person counseling. Using these two strategies together makes you more likely to succeed in quitting than if you used either strategy on its own.  If you are pregnant or breastfeeding, talk with your health care provider about finding counseling or other support strategies to quit smoking. Do not take medicine to help you quit smoking unless your health care provider tells you to do so. To quit smoking: Quit right away  Quit smoking completely, instead of gradually reducing how much you smoke over a period of time. Research shows that stopping smoking right away is more successful than gradually quitting.  Attend in-person counseling to help you build problem-solving skills. You are more likely to succeed in quitting if you attend counseling sessions regularly. Even short sessions of 10 minutes can be effective. Take medicine You may take medicines to help you quit smoking. Some medicines require a prescription and some you can purchase over-the-counter. Medicines may have nicotine in them to replace the nicotine in cigarettes. Medicines may:  Help to stop cravings.  Help to relieve withdrawal symptoms. Your health care provider may recommend:  Nicotine patches, gum, or lozenges.  Nicotine inhalers or sprays.  Non-nicotine medicine that is taken by mouth. Find  resources Find resources and support systems that can help you to quit smoking and remain smoke-free after you quit. These resources are most helpful when you use them often. They include:  Online chats with a Social worker.  Telephone quitlines.  Printed Furniture conservator/restorer.  Support groups or group counseling.  Text messaging programs.  Mobile phone apps or applications. Use apps that can help you stick to your quit plan by providing reminders, tips, and encouragement. There are many free apps for mobile devices as well as websites. Examples include Quit Guide from the [employer] and smokefree.gov What things can I do to make it easier to quit?   Reach out to your family and friends for support and encouragement. Call telephone quitlines (1-800-QUIT-NOW), reach out to support groups, or work with a counselor for support.  Ask people who smoke to avoid smoking around you.  Avoid places that trigger you to smoke, such as bars, parties, or smoke-break areas at work.  Spend time with people who do not smoke.  Lessen the stress in your life. Stress can be a smoking trigger for some people. To lessen stress, try: ? Exercising  regularly. ? Doing deep-breathing exercises. ? Doing yoga. ? Meditating. ? Performing a body scan. This involves closing your eyes, scanning your body from head to toe, and noticing which parts of your body are particularly tense. Try to relax the muscles in those areas. How will I feel when I quit smoking? Day 1 to 3 weeks Within the first 24 hours of quitting smoking, you may start to feel withdrawal symptoms. These symptoms are usually most noticeable 2-3 days after quitting, but they usually do not last for more than 2-3 weeks. You may experience these symptoms:  Mood swings.  Restlessness, anxiety, or irritability.  Trouble concentrating.  Dizziness.  Strong cravings for sugary foods and nicotine.  Mild weight gain.  Constipation.  Nausea.  Coughing or a  sore throat.  Changes in how the medicines that you take for unrelated issues work in your body.  Depression.  Trouble sleeping (insomnia). Week 3 and afterward After the first 2-3 weeks of quitting, you may start to notice more positive results, such as:  Improved sense of smell and taste.  Decreased coughing and sore throat.  Slower heart rate.  Lower blood pressure.  Clearer skin.  The ability to breathe more easily.  Fewer sick days. Quitting smoking can be very challenging. Do not get discouraged if you are not successful the first time. Some people need to make many attempts to quit before they achieve long-term success. Do your best to stick to your quit plan, and talk with your health care provider if you have any questions or concerns. Summary  Smoking tobacco is the leading cause of preventable death. Quitting smoking is one of the best things that you can do for your health.  When you decide to quit smoking, create a plan to help you succeed.  Quit smoking right away, not slowly over a period of time.  When you start quitting, seek help from your health care provider, family, or friends. This information is not intended to replace advice given to you by your health care provider. Make sure you discuss any questions you have with your health care provider. Document Revised: 02/21/2019 Document Reviewed: 08/17/2018 Elsevier Patient Education  [place] Your Hypertension Hypertension is commonly called high blood pressure. This is when the force of your blood pressing against the walls of your arteries is too strong. Arteries are blood vessels that carry blood from your heart throughout your body. Hypertension forces the heart to work harder to pump blood, and may cause the arteries to become narrow or stiff. Having untreated or uncontrolled hypertension can cause heart attack, stroke, kidney disease, and other problems. What are blood pressure  readings? A blood pressure reading consists of a higher number over a lower number. Ideally, your blood pressure should be below 120/80. The first ("top") number is called the systolic pressure. It is a measure of the pressure in your arteries as your heart beats. The second ("bottom") number is called the diastolic pressure. It is a measure of the pressure in your arteries as the heart relaxes. What does my blood pressure reading mean? Blood pressure is classified into four stages. Based on your blood pressure reading, your health care provider may use the following stages to determine what type of treatment you need, if any. Systolic pressure and diastolic pressure are measured in a unit called mm Hg. Normal  Systolic pressure: below 101.  Diastolic pressure: below 80. Elevated  Systolic pressure: 751-025.  Diastolic pressure: below 80.  Hypertension stage 1  Systolic pressure: 557-322.  Diastolic pressure: 02-54. Hypertension stage 2  Systolic pressure: 270 or above.  Diastolic pressure: 90 or above. What health risks are associated with hypertension? Managing your hypertension is an important responsibility. Uncontrolled hypertension can lead to:  A heart attack.  A stroke.  A weakened blood vessel (aneurysm).  Heart failure.  Kidney damage.  Eye damage.  Metabolic syndrome.  Memory and concentration problems. What changes can I make to manage my hypertension? Hypertension can be managed by making lifestyle changes and possibly by taking medicines. Your health care provider will help you make a plan to bring your blood pressure within a normal range. Eating and drinking   Eat a diet that is high in fiber and potassium, and low in salt (sodium), added sugar, and fat. An example eating plan is called the DASH (Dietary Approaches to Stop Hypertension) diet. To eat this way: ? Eat plenty of fresh fruits and vegetables. Try to fill half of your plate at each meal with  fruits and vegetables. ? Eat whole grains, such as whole wheat pasta, brown rice, or whole grain bread. Fill about one quarter of your plate with whole grains. ? Eat low-fat diary products. ? Avoid fatty cuts of meat, processed or cured meats, and poultry with skin. Fill about one quarter of your plate with lean proteins such as fish, chicken without skin, beans, eggs, and tofu. ? Avoid premade and processed foods. These tend to be higher in sodium, added sugar, and fat.  Reduce your daily sodium intake. Most people with hypertension should eat less than 1,500 mg of sodium a day.  Limit alcohol intake to no more than 1 drink a day for nonpregnant women and 2 drinks a day for men. One drink equals 12 oz of beer, 5 oz of wine, or 1 oz of hard liquor. Lifestyle  Work with your health care provider to maintain a healthy body weight, or to lose weight. Ask what an ideal weight is for you.  Get at least 30 minutes of exercise that causes your heart to beat faster (aerobic exercise) most days of the week. Activities may include walking, swimming, or biking.  Include exercise to strengthen your muscles (resistance exercise), such as weight lifting, as part of your weekly exercise routine. Try to do these types of exercises for 30 minutes at least 3 days a week.  Do not use any products that contain nicotine or tobacco, such as cigarettes and e-cigarettes. If you need help quitting, ask your health care provider.  Control any long-term (chronic) conditions you have, such as high cholesterol or diabetes. Monitoring  Monitor your blood pressure at home as told by your health care provider. Your personal target blood pressure may vary depending on your medical conditions, your age, and other factors.  Have your blood pressure checked regularly, as often as told by your health care provider. Working with your health care provider  Review all the medicines you take with your health care provider  because there may be side effects or interactions.  Talk with your health care provider about your diet, exercise habits, and other lifestyle factors that may be contributing to hypertension.  Visit your health care provider regularly. Your health care provider can help you create and adjust your plan for managing hypertension. Will I need medicine to control my blood pressure? Your health care provider may prescribe medicine if lifestyle changes are not enough to get your blood pressure under control,  and if:  Your systolic blood pressure is 130 or higher.  Your diastolic blood pressure is 80 or higher. Take medicines only as told by your health care provider. Follow the directions carefully. Blood pressure medicines must be taken as prescribed. The medicine does not work as well when you skip doses. Skipping doses also puts you at risk for problems. Contact a health care provider if:  You think you are having a reaction to medicines you have taken.  You have repeated (recurrent) headaches.  You feel dizzy.  You have swelling in your ankles.  You have trouble with your vision. Get help right away if:  You develop a severe headache or confusion.  You have unusual weakness or numbness, or you feel faint.  You have severe pain in your chest or abdomen.  You vomit repeatedly.  You have trouble breathing. Summary  Hypertension is when the force of blood pumping through your arteries is too strong. If this condition is not controlled, it may put you at risk for serious complications.  Your personal target blood pressure may vary depending on your medical conditions, your age, and other factors. For most people, a normal blood pressure is less than 120/80.  Hypertension is managed by lifestyle changes, medicines, or both. Lifestyle changes include weight loss, eating a healthy, low-sodium diet, exercising more, and limiting alcohol. This information is not intended to replace  advice given to you by your health care provider. Make sure you discuss any questions you have with your health care provider. Document Revised: 09/20/2018 Document Reviewed: 04/26/2016 Elsevier Patient Education  2020 [place] Risks of Smoking Smoking cigarettes is very bad for your health. Tobacco smoke has over 200 known poisons in it. It contains the poisonous gases nitrogen oxide and carbon monoxide. There are over 60 chemicals in tobacco smoke that cause cancer. Smoking is difficult to quit because a chemical in tobacco, called nicotine, causes addiction or dependence. When you smoke and inhale, nicotine is absorbed rapidly into the bloodstream through your lungs. Both inhaled and non-inhaled nicotine may be addictive. What are the risks of cigarette smoke? Cigarette smokers have an increased risk of many serious medical problems, including:  Lung cancer.  Lung disease, such as pneumonia, bronchitis, and emphysema.  Chest pain (angina) and heart attack because the heart is not getting enough oxygen.  Heart disease and peripheral blood vessel disease.  High blood pressure (hypertension).  Stroke.  Oral cancer, including cancer of the lip, mouth, or voice box.  Bladder cancer.  Pancreatic cancer.  Cervical cancer.  Pregnancy complications, including premature birth.  Stillbirths and smaller newborn babies, birth defects, and genetic damage to sperm.  Early menopause.  Lower estrogen level for women.  Infertility.  Facial wrinkles.  Blindness.  Increased risk of broken bones (fractures).  Senile dementia.  Stomach ulcers and internal bleeding.  Delayed wound healing and increased risk of complications during surgery.  Even smoking lightly shortens your life expectancy by several years. Because of secondhand smoke exposure, children of smokers have an increased risk of the following:  Sudden infant death syndrome (SIDS).  Respiratory  infections.  Lung cancer.  Heart disease.  Ear infections. What are the benefits of quitting? There are many health benefits of quitting smoking. Here are some of them:  Within days of quitting smoking, your risk of having a heart attack decreases, your blood flow improves, and your lung capacity improves. Blood pressure, pulse rate, and breathing patterns start returning to normal  soon after quitting.  Within months, your lungs may clear up completely.  Quitting for 10 years reduces your risk of developing lung cancer and heart disease to almost that of a nonsmoker.  People who quit may see an improvement in their overall quality of life. How do I quit smoking?     Smoking is an addiction with both physical and psychological effects, and longtime habits can be hard to change. Your health care provider can recommend:  Programs and community resources, which may include group support, education, or talk therapy.  Prescription medicines to help reduce cravings.  Nicotine replacement products, such as patches, gum, and nasal sprays. Use these products only as directed. Do not replace cigarette smoking with electronic cigarettes, which are commonly called e-cigarettes. The safety of e-cigarettes is not known, and some may contain harmful chemicals.  A combination of two or more of these methods. Where to find more information  American Lung Association: www.lung.org  American Cancer Society: www.cancer.org Summary  Smoking cigarettes is very bad for your health. Cigarette smokers have an increased risk of many serious medical problems, including several cancers, heart disease, and stroke.  Smoking is an addiction with both physical and psychological effects, and longtime habits can be hard to change.  By stopping right away, you can greatly reduce the risk of medical problems for you and your family.  To help you quit smoking, your health care provider can recommend programs,  community resources, prescription medicines, and nicotine replacement products such as patches, gum, and nasal sprays. This information is not intended to replace advice given to you by your health care provider. Make sure you discuss any questions you have with your health care provider. Document Revised: 08/30/2017 Document Reviewed: 06/02/2016 Elsevier Patient Education  [place]. Nicotine chewing gum What is this medicine? NICOTINE ([place] teen) helps people stop smoking. This medicine replaces the nicotine found in cigarettes and helps to decrease withdrawal effects. It is most effective when used in combination with a stop-smoking program. This medicine may be used for other purposes; ask your health care provider or pharmacist if you have questions. COMMON BRAND NAME(S): NICOrelief, Nicorette What should I tell my health care provider before I take this medicine? They need to know if you have any of these conditions:  diabetes  heart disease, angina, irregular heartbeat or previous heart attack  high blood pressure  lung disease, including asthma  overactive thyroid  pheochromocytoma  seizures or history of seizures  stomach problems or ulcers  an unusual or allergic reaction to nicotine, other medicines, foods, dyes, or preservatives  pregnant or trying to get pregnant  breast-feeding How should I use this medicine? Chew but do not swallow the gum. Follow the directions that come with the chewing gum. Use exactly as directed. When you feel an urgent desire for a cigarette, chew one piece of gum slowly. Continue chewing until you taste the gum or feel a slight tingling in your mouth. Then, stop chewing and place the gum between your cheek and gum. Wait until the taste or tingling is almost gone then start chewing again. Continue chewing in this manner for about 30 minutes. Slow chewing helps reduce cravings and also helps reduce the chance for heartburn or other  gastrointestinal side effects. Talk to your pediatrician regarding the use of this medicine in children. Special care may be needed. Overdosage: If you think you have taken too much of this medicine contact a poison control center or emergency  room at once. NOTE: This medicine is only for you. Do not share this medicine with others. What if I miss a dose? This does not apply. Only use the chewing gum when you have a strong desire to smoke. Do not use more than one piece of gum at a time. What may interact with this medicine?  medicines for asthma  medicines for blood pressure  medicines for mental depression This list may not describe all possible interactions. Give your health care provider a list of all the medicines, herbs, non-prescription drugs, or dietary supplements you use. Also tell them if you smoke, drink alcohol, or use illegal drugs. Some items may interact with your medicine. What should I watch for while using this medicine? Always carry the nicotine gum with you. Do not use more than 30 pieces of gum a day. Too much gum can increase the risk of an overdose. As the urge to smoke gets less, gradually reduce the number of pieces each day over a period of 2 to 3 months. When you are only using 1 or 2 pieces a day, stop using the nicotine gum. You should begin using the nicotine gum the day you stop smoking. It is okay if you do not succeed with the attempt to quit and have a cigarette. You can still continue your quit attempt and keep using the product as directed. Just throw away your cigarettes and get back to your quit plan. If your mouth gets sore from chewing the gum, suck hard sugarless candy between pieces of gum to help relieve the soreness. Brush your teeth regularly to reduce mouth irritation. If you wear dentures, contact your doctor or health care professional if the gum sticks to your dental work. If you are a diabetic and you quit smoking, the effects of insulin may be  increased and you may need to reduce your insulin dose. Check with your doctor or health care professional about how you should adjust your insulin dose. What side effects may I notice from receiving this medicine? Side effects that you should report to your doctor or health care professional as soon as possible:  allergic reactions like skin rash, itching or hives, swelling of the face, lips, or tongue  blisters in mouth  breathing problems  changes in hearing  changes in vision  chest pain  cold sweats  confusion  fast, irregular heartbeat  feeling faint or lightheaded, falls  headache  increased saliva  nausea, vomiting  stomach pain  weakness Side effects that usually do not require medical attention (report to your doctor or health care professional if they continue or are bothersome):  diarrhea  dry mouth  hiccups  irritability  nervousness or restlessness  trouble sleeping or vivid dreams This list may not describe all possible side effects. Call your doctor for medical advice about side effects. You may report side effects to FDA at 1-800-FDA-1088. Where should I keep my medicine? Keep out of the reach of children. Store at room temperature between 15 and 30 degrees C (59 and 86 degrees F). Protect from heat and light. Throw away unused medicine after the expiration date. NOTE: This sheet is a summary. It may not cover all possible information. If you have questions about this medicine, talk to your doctor, pharmacist, or health care provider.  2020 Elsevier/Gold Standard (2014-11-23 19:37:14)

## 2019-12-12 NOTE — Progress Notes (Signed)
Subjective:    Patient ID: Patricia [name], female    DOB: [birth year], 54 y.o.   MRN: [id number]  No chief complaint on file.   HPI Pt is a 54 yo female with pmh sig for H/o PE, HTN, nicotine use who was seen today for LE edema x 1 month.  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  Patient denies increased sodium intake, SOB, fatigue, or headache.  Pt has not checked BP since April.  Edema is constantly present, worse at the end of the day, and causes pain in the right leg greater than left.  Pt tried OTC water pills.  Eating mostly salads.  Wore heels twice in the last month, but typically wears tennis shoes or crocs.  Pt smoking 1 pack daily q 1.5-2 days.  Pt notes feeling nauseous on Chantix twice daily.  Attributes this feeling not eating breakfast in the morning before taking the medication.  Pt had the J&J Covid vaccine on 08/24/2019.  Past Medical History:  Diagnosis Date  . Clotting disorder ([place])   . Hypertension     No Known Allergies  ROS General: Denies fever, chills, night sweats, changes in weight, changes in appetite HEENT: Denies headaches, ear pain, changes in vision, rhinorrhea, sore throat CV: Denies CP, palpitations, SOB, orthopnea,+ LE edema Pulm: Denies SOB, cough, wheezing GI: Denies abdominal pain, nausea, vomiting, diarrhea, constipation GU: Denies dysuria, hematuria, frequency, vaginal discharge Msk: Denies muscle cramps, joint pains  + LE edema and occasional pain Neuro: Denies weakness, numbness, tingling Skin: Denies rashes, bruising Psych: Denies depression, anxiety, hallucinations    Objective:    Blood pressure 132/90, pulse 68, temperature 97.7 F (36.5 C), temperature source Temporal, weight 282 lb (127.9 kg), SpO2 99 %.  Gen. Pleasant, well-nourished, obese, in no distress, normal affect   HEENT: [place]/AT, face symmetric, mild mild exophthalmos, conjunctiva clear, no scleral icterus, PERRLA, EOMI, nares patent  without drainage Lungs: no accessory muscle use, CTAB, no wheezes or rales Cardiovascular: RRR, no m/r/g, 1+ pitting edema in bilateral ankles and shins, trace pedal edema b/l. Musculoskeletal: TTP of lateral right foot with edema noted, no deformities, no cyanosis or clubbing, normal tone Neuro:  A&Ox3, CN II-XII intact, normal gait Skin:  Warm, no lesions/ rash.  Well-healed surgical incision on dorsum of left great toe.   Wt Readings from Last 3 Encounters:  12/12/19 282 lb (127.9 kg)  03/19/18 270 lb (122.5 kg)  03/15/18 269 lb (122 kg)    Lab Results  Component Value Date   WBC 7.3 04/02/2017   HGB 13.7 04/02/2017   HCT 42.3 04/02/2017   PLT 367.0 04/02/2017   GLUCOSE 84 04/02/2017   CHOL 163 04/02/2017   TRIG 98.0 04/02/2017   HDL 50.30 04/02/2017   LDLCALC 93 04/02/2017   ALT 22 06/13/2018   AST 16 06/13/2018   NA 142 04/02/2017   K 4.1 04/02/2017   CL 104 04/02/2017   CREATININE 0.79 04/02/2017   BUN 13 04/02/2017   CO2 31 04/02/2017   TSH 1.27 04/02/2017    Assessment/Plan:  Bilateral lower extremity edema  -Discussed possible causes including dependent edema, CHF, nutritional deficiency, etc. -We will obtain labs -Discussed supportive care including elevating lower extremities when sitting, taking breaks from prolonged sitting, compression socks, TED hose, decrease sodium intake -Given handout -Discussed low-dose diuretic pending labs - Plan: CBC (no diff), Comprehensive metabolic panel, Brain Natriuretic Peptide, TSH, T4, Free  Essential hypertension  -Mildly  elevated -Discussed supportive care/lifestyle changes -We will obtain labs -Consider low-dose diuretic such as HCTZ or chlorthalidone based on lab results.  Advised will likely need potassium supplement on either medication - Plan: Comprehensive metabolic panel  Cigarette nicotine dependence without complication  -Smoking cessation counseling greater than 3 minutes, less than 10 minutes -Patient  smoking 1 pack daily 1.5-2 days -We will DC Chantix 2/2 nausea -will try nicorette gum -Given handout - Plan: CBC (no diff), nicotine polacrilex (NICORETTE) 4 MG gum  F/u in 2-4 wks  Grier Mitts, MD

## 2019-12-29 ENCOUNTER — Encounter: Payer: Self-pay | Admitting: Family Medicine

## 2020-02-08 ENCOUNTER — Other Ambulatory Visit: Payer: Self-pay | Admitting: Family Medicine

## 2020-02-08 DIAGNOSIS — R600 Localized edema: Secondary | ICD-10-CM

## 2020-02-08 DIAGNOSIS — I10 Essential (primary) hypertension: Secondary | ICD-10-CM

## 2020-04-05 ENCOUNTER — Encounter: Payer: Self-pay | Admitting: Family Medicine

## 2020-04-05 ENCOUNTER — Ambulatory Visit (INDEPENDENT_AMBULATORY_CARE_PROVIDER_SITE_OTHER): Payer: BC Managed Care – PPO | Admitting: Family Medicine

## 2020-04-05 ENCOUNTER — Other Ambulatory Visit: Payer: Self-pay

## 2020-04-05 VITALS — BP 108/70 | HR 71 | Temp 98.6°F | Ht 67.0 in | Wt 276.0 lb

## 2020-04-05 DIAGNOSIS — Z6841 Body Mass Index (BMI) 40.0 and over, adult: Secondary | ICD-10-CM

## 2020-04-05 DIAGNOSIS — K5900 Constipation, unspecified: Secondary | ICD-10-CM

## 2020-04-05 DIAGNOSIS — Z23 Encounter for immunization: Secondary | ICD-10-CM | POA: Diagnosis not present

## 2020-04-05 DIAGNOSIS — Z0000 Encounter for general adult medical examination without abnormal findings: Secondary | ICD-10-CM | POA: Diagnosis not present

## 2020-04-05 DIAGNOSIS — F17210 Nicotine dependence, cigarettes, uncomplicated: Secondary | ICD-10-CM | POA: Diagnosis not present

## 2020-04-05 NOTE — Progress Notes (Signed)
Subjective:     Patricia [name] is a 54 y.o. female and is here for a comprehensive physical exam. The patient reports more cramps since taking potassium.  Now having cramps in b/l thighs at night.  Taking Klor-con 20 mEq in am.  Patient taking chlorthalidone 25 mg daily for blood pressure.  Is not checking BP at home.  States walking 30 minutes each evening.  Patient endorses constipation x1 week.  Notes abdominal fullness, small hard stools.  Patient tried to 4 and mag citrate.  Patient drinking maybe 40 ounces of water per day.  Patient states she changed her diet to eating more vegetables and less greasy, fast foods.  Pt smoking 1 pack of cigarettes per week.  States not smoking during the day.  Using Nicorette gum to help to quit.   Patient had 1 dose of [employer] COVID-19 vaccine.  She plans to get a booster dose when available.  Patient had colonoscopy in 2020 Social History   Socioeconomic History  . Marital status: Married    Spouse name: Not on file  . Number of children: Not on file  . Years of education: Not on file  . Highest education level: Not on file  Occupational History  . Not on file  Tobacco Use  . Smoking status: Current Some Day Smoker    Packs/day: 0.30    Types: Cigarettes  . Smokeless tobacco: Never Used  . Tobacco comment: using electronic cigarettes too  Substance and Sexual Activity  . Alcohol use: Yes    Comment: occasional  . Drug use: No  . Sexual activity: Not on file  Other Topics Concern  . Not on file  Social History Narrative  . Not on file   Social Determinants of Health   Financial Resource Strain:   . Difficulty of Paying Living Expenses: Not on file  Food Insecurity:   . Worried About Charity fundraiser in the Last Year: Not on file  . Ran Out of Food in the Last Year: Not on file  Transportation Needs:   . Lack of Transportation (Medical): Not on file  . Lack of Transportation (Non-Medical): Not on file  Physical  Activity:   . Days of Exercise per Week: Not on file  . Minutes of Exercise per Session: Not on file  Stress:   . Feeling of Stress : Not on file  Social Connections:   . Frequency of Communication with Friends and Family: Not on file  . Frequency of Social Gatherings with Friends and Family: Not on file  . Attends Religious Services: Not on file  . Active Member of Clubs or Organizations: Not on file  . Attends Archivist Meetings: Not on file  . Marital Status: Not on file  Intimate Partner Violence:   . Fear of Current or Ex-Partner: Not on file  . Emotionally Abused: Not on file  . Physically Abused: Not on file  . Sexually Abused: Not on file   Health Maintenance  Topic Date Due  . Hepatitis C Screening  Never done  . HIV Screening  Never done  . PAP SMEAR-Modifier  Never done  . TETANUS/TDAP  08/15/2018  . INFLUENZA VACCINE  01/11/2020  . MAMMOGRAM  08/18/2020  . COLONOSCOPY  03/19/2028  . COVID-19 Vaccine  Completed    The following portions of the patient's history were reviewed and updated as appropriate: allergies, current medications, past family history, past medical history, past social history, past surgical  history and problem list.  Review of Systems Pertinent items noted in HPI and remainder of comprehensive ROS otherwise negative.   Objective:    BP 108/70 (BP Location: Left Arm, Patient Position: Sitting, Cuff Size: Normal)   Pulse 71   Temp 98.6 F (37 C) (Oral)   Ht 5\' 7"  (1.702 m)   Wt 276 lb (125.2 kg)   SpO2 99%   BMI 43.23 kg/m  General appearance: alert, cooperative and no distress Head: Normocephalic, without obvious abnormality, atraumatic Eyes: conjunctivae/corneas clear. PERRL, EOM's intact. Fundi benign. Ears: normal TM's and external ear canals both ears Nose: Nares normal. Septum midline. Mucosa normal. No drainage or sinus tenderness. Throat: lips, mucosa, and tongue normal; teeth and gums normal Neck: no adenopathy, no  carotid bruit, no JVD, supple, symmetrical, trachea midline and thyroid not enlarged, symmetric, no tenderness/mass/nodules Lungs: clear to auscultation bilaterally Heart: regular rate and rhythm, S1, S2 normal, no murmur, click, rub or gallop Abdomen: soft, non-tender; bowel sounds normal; no masses,  no organomegaly Extremities: extremities normal, atraumatic, no cyanosis or edema Pulses: 2+ and symmetric Skin: Skin color, texture, turgor normal. No rashes or lesions Lymph nodes: Cervical, supraclavicular, and axillary nodes normal. Neurologic: Alert and oriented X 3, normal strength and tone. Normal symmetric reflexes. Normal coordination and gait    Assessment:    Healthy female exam with constipation.     Plan:     Anticipatory guidance given including wearing seatbelts, smoke detectors in the home, increasing physical activity, increasing p.o. intake of water and vegetables. -will obtain labs -pap due.  Pt declines at this time -colonoscopy done 2020. -mammogram due.  Pt advised to schedule -form for  -given handout -next CPE in 1 yr See After Visit Summary for Counseling Recommendations    Cigarette nicotine dependence without complication  -Cessation counseling greater than 3 minutes, less than 10 minutes -Smoking 1 pack/week -Using Nicorette gum -Given handout -We will continue to monitor - Plan: CBC with Differential/Platelet  Constipation, unspecified constipation type -Discussed lifestyle modifications -Patient advised to increase p.o. intake of water and fluids -Use MiraLAX twice daily -Follow-up for continued or worsening symptoms - Plan: COMPLETE METABOLIC PANEL WITH GFR  Class 3 severe obesity due to excess calories with serious comorbidity and body mass index (BMI) of 40.0 to 44.9 in adult [facility]) -Continue walking for exercise -Discussed other lifestyle modifications - Plan: Lipid panel, TSH, Hemoglobin A1c, T4, free  Need for immunization against  influenza  - Plan: Flu Vaccine QUAD 6+ mos PF IM (Fluarix Quad PF)  F/u in the next wk for continued constipation  Grier Mitts, MD

## 2020-04-05 NOTE — Patient Instructions (Signed)
Preventive Care 40-53 Years Old, Female Preventive care refers to visits with your health care provider and lifestyle choices that can promote health and wellness. This includes:  A yearly physical exam. This may also be called an annual well check.  Regular dental visits and eye exams.  Immunizations.  Screening for certain conditions.  Healthy lifestyle choices, such as eating a healthy diet, getting regular exercise, not using drugs or products that contain nicotine and tobacco, and limiting alcohol use. What can I expect for my preventive care visit? Physical exam Your health care provider will check your:  Height and weight. This may be used to calculate body mass index (BMI), which tells if you are at a healthy weight.  Heart rate and blood pressure.  Skin for abnormal spots. Counseling Your health care provider may ask you questions about your:  Alcohol, tobacco, and drug use.  Emotional well-being.  Home and relationship well-being.  Sexual activity.  Eating habits.  Work and work environment.  Method of birth control.  Menstrual cycle.  Pregnancy history. What immunizations do I need?  Influenza (flu) vaccine  This is recommended every year. Tetanus, diphtheria, and pertussis (Tdap) vaccine  You may need a Td booster every 10 years. Varicella (chickenpox) vaccine  You may need this if you have not been vaccinated. Zoster (shingles) vaccine  You may need this after age 60. Measles, mumps, and rubella (MMR) vaccine  You may need at least one dose of MMR if you were born in 1957 or later. You may also need a second dose. Pneumococcal conjugate (PCV13) vaccine  You may need this if you have certain conditions and were not previously vaccinated. Pneumococcal polysaccharide (PPSV23) vaccine  You may need one or two doses if you smoke cigarettes or if you have certain conditions. Meningococcal conjugate (MenACWY) vaccine  You may need this if you  have certain conditions. Hepatitis A vaccine  You may need this if you have certain conditions or if you travel or work in places where you may be exposed to hepatitis A. Hepatitis B vaccine  You may need this if you have certain conditions or if you travel or work in places where you may be exposed to hepatitis B. Haemophilus influenzae type b (Hib) vaccine  You may need this if you have certain conditions. Human papillomavirus (HPV) vaccine  If recommended by your health care provider, you may need three doses over 6 months. You may receive vaccines as individual doses or as more than one vaccine together in one shot (combination vaccines). Talk with your health care provider about the risks and benefits of combination vaccines. What tests do I need? Blood tests  Lipid and cholesterol levels. These may be checked every 5 years, or more frequently if you are over 50 years old.  Hepatitis C test.  Hepatitis B test. Screening  Lung cancer screening. You may have this screening every year starting at age 55 if you have a 30-pack-year history of smoking and currently smoke or have quit within the past 15 years.  Colorectal cancer screening. All adults should have this screening starting at age 50 and continuing until age 75. Your health care provider may recommend screening at age 45 if you are at increased risk. You will have tests every 1-10 years, depending on your results and the type of screening test.  Diabetes screening. This is done by checking your blood sugar (glucose) after you have not eaten for a while (fasting). You may have this   done every 1-3 years.  Mammogram. This may be done every 1-2 years. Talk with your health care provider about when you should start having regular mammograms. This may depend on whether you have a family history of breast cancer.  BRCA-related cancer screening. This may be done if you have a family history of breast, ovarian, tubal, or peritoneal  cancers.  Pelvic exam and Pap test. This may be done every 3 years starting at age 60. Starting at age 53, this may be done every 5 years if you have a Pap test in combination with an HPV test. Other tests  Sexually transmitted disease (STD) testing.  Bone density scan. This is done to screen for osteoporosis. You may have this scan if you are at high risk for osteoporosis. Follow these instructions at home: Eating and drinking  Eat a diet that includes fresh fruits and vegetables, whole grains, lean protein, and low-fat dairy.  Take vitamin and mineral supplements as recommended by your health care provider.  Do not drink alcohol if: ? Your health care provider tells you not to drink. ? You are pregnant, may be pregnant, or are planning to become pregnant.  If you drink alcohol: ? Limit how much you have to 0-1 drink a day. ? Be aware of how much alcohol is in your drink. In the [place] one drink equals one 12 oz bottle of beer (355 mL), one 5 oz glass of wine (148 mL), or one 1 oz glass of hard liquor (44 mL). Lifestyle  Take daily care of your teeth and gums.  Stay active. Exercise for at least 30 minutes on 5 or more days each week.  Do not use any products that contain nicotine or tobacco, such as cigarettes, e-cigarettes, and chewing tobacco. If you need help quitting, ask your health care provider.  If you are sexually active, practice safe sex. Use a condom or other form of birth control (contraception) in order to prevent pregnancy and STIs (sexually transmitted infections).  If told by your health care provider, take low-dose aspirin daily starting at age 63. What's next?  Visit your health care provider once a year for a well check visit.  Ask your health care provider how often you should have your eyes and teeth checked.  Stay up to date on all vaccines. This information is not intended to replace advice given to you by your health care provider. Make sure you  discuss any questions you have with your health care provider. Document Revised: 02/07/2018 Document Reviewed: 02/07/2018 Elsevier Patient Education  2020 [employer].  Steps to Quit Smoking Smoking tobacco is the leading cause of preventable death. It can affect almost every organ in the body. Smoking puts you and people around you at risk for many serious, long-lasting (chronic) diseases. Quitting smoking can be hard, but it is one of the best things that you can do for your health. It is never too late to quit. How do I get ready to quit? When you decide to quit smoking, make a plan to help you succeed. Before you quit:  Pick a date to quit. Set a date within the next 2 weeks to give you time to prepare.  Write down the reasons why you are quitting. Keep this list in places where you will see it often.  Tell your family, friends, and co-workers that you are quitting. Their support is important.  Talk with your doctor about the choices that may help you quit.  Find  out if your health insurance will pay for these treatments.  Know the people, places, things, and activities that make you want to smoke (triggers). Avoid them. What first steps can I take to quit smoking?  Throw away all cigarettes at home, at work, and in your car.  Throw away the things that you use when you smoke, such as ashtrays and lighters.  Clean your car. Make sure to empty the ashtray.  Clean your home, including curtains and carpets. What can I do to help me quit smoking? Talk with your doctor about taking medicines and seeing a counselor at the same time. You are more likely to succeed when you do both.  If you are pregnant or breastfeeding, talk with your doctor about counseling or other ways to quit smoking. Do not take medicine to help you quit smoking unless your doctor tells you to do so. To quit smoking: Quit right away  Quit smoking totally, instead of slowly cutting back on how much you smoke  over a period of time.  Go to counseling. You are more likely to quit if you go to counseling sessions regularly. Take medicine You may take medicines to help you quit. Some medicines need a prescription, and some you can buy over-the-counter. Some medicines may contain a drug called nicotine to replace the nicotine in cigarettes. Medicines may:  Help you to stop having the desire to smoke (cravings).  Help to stop the problems that come when you stop smoking (withdrawal symptoms). Your doctor may ask you to use:  Nicotine patches, gum, or lozenges.  Nicotine inhalers or sprays.  Non-nicotine medicine that is taken by mouth. Find resources Find resources and other ways to help you quit smoking and remain smoke-free after you quit. These resources are most helpful when you use them often. They include:  Online chats with a Social worker.  Phone quitlines.  Printed Furniture conservator/restorer.  Support groups or group counseling.  Text messaging programs.  Mobile phone apps. Use apps on your mobile phone or tablet that can help you stick to your quit plan. There are many free apps for mobile phones and tablets as well as websites. Examples include Quit Guide from the [employer] and smokefree.gov  What things can I do to make it easier to quit?   Talk to your family and friends. Ask them to support and encourage you.  Call a phone quitline (1-800-QUIT-NOW), reach out to support groups, or work with a Social worker.  Ask people who smoke to not smoke around you.  Avoid places that make you want to smoke, such as: ? Bars. ? Parties. ? Smoke-break areas at work.  Spend time with people who do not smoke.  Lower the stress in your life. Stress can make you want to smoke. Try these things to help your stress: ? Getting regular exercise. ? Doing deep-breathing exercises. ? Doing yoga. ? Meditating. ? Doing a body scan. To do this, close your eyes, focus on one area of your body at a time from head  to toe. Notice which parts of your body are tense. Try to relax the muscles in those areas. How will I feel when I quit smoking? Day 1 to 3 weeks Within the first 24 hours, you may start to have some problems that come from quitting tobacco. These problems are very bad 2-3 days after you quit, but they do not often last for more than 2-3 weeks. You may get these symptoms:  Mood swings.  Feeling  restless, nervous, angry, or annoyed.  Trouble concentrating.  Dizziness.  Strong desire for high-sugar foods and nicotine.  Weight gain.  Trouble pooping (constipation).  Feeling like you may vomit (nausea).  Coughing or a sore throat.  Changes in how the medicines that you take for other issues work in your body.  Depression.  Trouble sleeping (insomnia). Week 3 and afterward After the first 2-3 weeks of quitting, you may start to notice more positive results, such as:  Better sense of smell and taste.  Less coughing and sore throat.  Slower heart rate.  Lower blood pressure.  Clearer skin.  Better breathing.  Fewer sick days. Quitting smoking can be hard. Do not give up if you fail the first time. Some people need to try a few times before they succeed. Do your best to stick to your quit plan, and talk with your doctor if you have any questions or concerns. Summary  Smoking tobacco is the leading cause of preventable death. Quitting smoking can be hard, but it is one of the best things that you can do for your health.  When you decide to quit smoking, make a plan to help you succeed.  Quit smoking right away, not slowly over a period of time.  When you start quitting, seek help from your doctor, family, or friends. This information is not intended to replace advice given to you by your health care provider. Make sure you discuss any questions you have with your health care provider. Document Revised: 02/21/2019 Document Reviewed: 08/17/2018 Elsevier Patient Education   [place].  Constipation, Adult Constipation is when a person has fewer bowel movements in a week than normal, has difficulty having a bowel movement, or has stools that are dry, hard, or larger than normal. Constipation may be caused by an underlying condition. It may become worse with age if a person takes certain medicines and does not take in enough fluids. Follow these instructions at home: Eating and drinking   Eat foods that have a lot of fiber, such as fresh fruits and vegetables, whole grains, and beans.  Limit foods that are high in fat, low in fiber, or overly processed, such as french fries, hamburgers, cookies, candies, and soda.  Drink enough fluid to keep your urine clear or pale yellow. General instructions  Exercise regularly or as told by your health care provider.  Go to the restroom when you have the urge to go. Do not hold it in.  Take over-the-counter and prescription medicines only as told by your health care provider. These include any fiber supplements.  Practice pelvic floor retraining exercises, such as deep breathing while relaxing the lower abdomen and pelvic floor relaxation during bowel movements.  Watch your condition for any changes.  Keep all follow-up visits as told by your health care provider. This is important. Contact a health care provider if:  You have pain that gets worse.  You have a fever.  You do not have a bowel movement after 4 days.  You vomit.  You are not hungry.  You lose weight.  You are bleeding from the anus.  You have thin, pencil-like stools. Get help right away if:  You have a fever and your symptoms suddenly get worse.  You leak stool or have blood in your stool.  Your abdomen is bloated.  You have severe pain in your abdomen.  You feel dizzy or you faint. This information is not intended to replace advice given to you  by your health care provider. Make sure you discuss any questions you have  with your health care provider. Document Revised: 05/11/2017 Document Reviewed: 11/17/2015 Elsevier Patient Education  2020 [employer].

## 2020-04-06 ENCOUNTER — Encounter: Payer: Self-pay | Admitting: Family Medicine

## 2020-04-06 DIAGNOSIS — R7303 Prediabetes: Secondary | ICD-10-CM | POA: Insufficient documentation

## 2020-04-06 LAB — CBC WITH DIFFERENTIAL/PLATELET
Absolute Monocytes: 443 cells/uL (ref 200–950)
Basophils Absolute: 68 cells/uL (ref 0–200)
Basophils Relative: 0.9 %
Eosinophils Absolute: 285 cells/uL (ref 15–500)
Eosinophils Relative: 3.8 %
HCT: 40.4 % (ref 35.0–45.0)
Hemoglobin: 12.8 g/dL (ref 11.7–15.5)
Lymphs Abs: 3255 cells/uL (ref 850–3900)
MCH: 30.1 pg (ref 27.0–33.0)
MCHC: 31.7 g/dL — ABNORMAL LOW (ref 32.0–36.0)
MCV: 95.1 fL (ref 80.0–100.0)
MPV: 10 fL (ref 7.5–12.5)
Monocytes Relative: 5.9 %
Neutro Abs: 3450 cells/uL (ref 1500–7800)
Neutrophils Relative %: 46 %
Platelets: 355 10*3/uL (ref 140–400)
RBC: 4.25 10*6/uL (ref 3.80–5.10)
RDW: 13.1 % (ref 11.0–15.0)
Total Lymphocyte: 43.4 %
WBC: 7.5 10*3/uL (ref 3.8–10.8)

## 2020-04-06 LAB — COMPLETE METABOLIC PANEL WITH GFR
AG Ratio: 1.6 (calc) (ref 1.0–2.5)
ALT: 21 U/L (ref 6–29)
AST: 15 U/L (ref 10–35)
Albumin: 3.9 g/dL (ref 3.6–5.1)
Alkaline phosphatase (APISO): 68 U/L (ref 37–153)
BUN: 17 mg/dL (ref 7–25)
CO2: 32 mmol/L (ref 20–32)
Calcium: 9.6 mg/dL (ref 8.6–10.4)
Chloride: 105 mmol/L (ref 98–110)
Creat: 0.83 mg/dL (ref 0.50–1.05)
GFR, Est African American: 93 mL/min/{1.73_m2} (ref >=60)
GFR, Est Non African American: 81 mL/min/{1.73_m2} (ref >=60)
Globulin: 2.4 g/dL (calc) (ref 1.9–3.7)
Glucose, Bld: 103 mg/dL — ABNORMAL HIGH (ref 65–99)
Potassium: 4.5 mmol/L (ref 3.5–5.3)
Sodium: 145 mmol/L (ref 135–146)
Total Bilirubin: 0.2 mg/dL (ref 0.2–1.2)
Total Protein: 6.3 g/dL (ref 6.1–8.1)

## 2020-04-06 LAB — TSH: TSH: 1.1 mIU/L

## 2020-04-06 LAB — LIPID PANEL
Cholesterol: 159 mg/dL (ref <200)
HDL: 51 mg/dL (ref >=50)
LDL Cholesterol (Calc): 87 mg/dL (calc)
Non-HDL Cholesterol (Calc): 108 mg/dL (calc) (ref <130)
Total CHOL/HDL Ratio: 3.1 (calc) (ref <5.0)
Triglycerides: 112 mg/dL (ref <150)

## 2020-04-06 LAB — HEMOGLOBIN A1C
Hgb A1c MFr Bld: 6.1 % of total Hgb — ABNORMAL HIGH (ref <5.7)
Mean Plasma Glucose: 128 (calc)
eAG (mmol/L): 7.1 (calc)

## 2020-04-06 LAB — T4, FREE: Free T4: 1 ng/dL (ref 0.8–1.8)

## 2020-04-07 ENCOUNTER — Other Ambulatory Visit: Payer: Self-pay | Admitting: Family Medicine

## 2020-04-07 DIAGNOSIS — I10 Essential (primary) hypertension: Secondary | ICD-10-CM

## 2020-04-07 DIAGNOSIS — R600 Localized edema: Secondary | ICD-10-CM

## 2020-08-16 ENCOUNTER — Other Ambulatory Visit: Payer: Self-pay | Admitting: Family Medicine

## 2020-08-16 DIAGNOSIS — R600 Localized edema: Secondary | ICD-10-CM

## 2020-08-16 DIAGNOSIS — I10 Essential (primary) hypertension: Secondary | ICD-10-CM

## 2020-08-23 ENCOUNTER — Encounter: Payer: Self-pay | Admitting: Family Medicine

## 2020-08-23 ENCOUNTER — Other Ambulatory Visit: Payer: Self-pay

## 2020-08-23 ENCOUNTER — Ambulatory Visit: Payer: BC Managed Care – PPO | Admitting: Family Medicine

## 2020-08-23 ENCOUNTER — Ambulatory Visit (INDEPENDENT_AMBULATORY_CARE_PROVIDER_SITE_OTHER): Payer: BC Managed Care – PPO

## 2020-08-23 VITALS — BP 130/78 | HR 64 | Temp 98.3°F | Ht 67.0 in | Wt 268.0 lb

## 2020-08-23 DIAGNOSIS — M546 Pain in thoracic spine: Secondary | ICD-10-CM | POA: Diagnosis not present

## 2020-08-23 DIAGNOSIS — M545 Low back pain, unspecified: Secondary | ICD-10-CM

## 2020-08-23 MED ORDER — CYCLOBENZAPRINE HCL 5 MG PO TABS: 5.0000 mg | ORAL_TABLET | Freq: Every evening | ORAL | 0 refills | Status: DC | PRN
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
## 2020-08-25 ENCOUNTER — Ambulatory Visit: Payer: BC Managed Care – PPO | Admitting: Family Medicine

## 2020-09-09 ENCOUNTER — Encounter: Payer: Self-pay | Admitting: Family Medicine

## 2020-11-11 ENCOUNTER — Other Ambulatory Visit: Payer: Self-pay | Admitting: Family Medicine

## 2020-11-11 DIAGNOSIS — R600 Localized edema: Secondary | ICD-10-CM

## 2020-11-11 DIAGNOSIS — I10 Essential (primary) hypertension: Secondary | ICD-10-CM

## 2021-02-16 ENCOUNTER — Other Ambulatory Visit: Payer: Self-pay

## 2021-02-17 ENCOUNTER — Encounter: Payer: Self-pay | Admitting: Family Medicine

## 2021-02-17 ENCOUNTER — Ambulatory Visit: Payer: BC Managed Care – PPO | Admitting: Family Medicine

## 2021-02-17 VITALS — BP 120/82 | HR 81 | Temp 98.2°F | Wt 261.0 lb

## 2021-02-17 DIAGNOSIS — M67472 Ganglion, left ankle and foot: Secondary | ICD-10-CM

## 2021-02-17 DIAGNOSIS — Z23 Encounter for immunization: Secondary | ICD-10-CM

## 2021-02-17 DIAGNOSIS — R600 Localized edema: Secondary | ICD-10-CM

## 2021-02-17 DIAGNOSIS — I10 Essential (primary) hypertension: Secondary | ICD-10-CM | POA: Diagnosis not present

## 2021-02-17 MED ORDER — CHLORTHALIDONE 25 MG PO TABS: ORAL_TABLET | ORAL | 1 refills | Status: DC

## 2021-02-17 NOTE — Progress Notes (Signed)
Subjective:    Patient ID: Patricia [name], female    DOB: 1966/04/06, 55 y.o.   MRN: [id number]  Chief Complaint  Patient presents with   Foot Swelling    Noticed a few days ago after getting pedicure, it is hard     HPI Patient was seen today for acute concern. Pt with a lump on the bottom of her L foot.  Unsure how long it has been there.  Noticed some soreness in bottom of foot, but didn't think much of it.  Area was brought to her attention when she was getting a pedicure.  Pt denies numbness or tingling in foot.  Wearing tennis shoes with insoles when walking.  Also wearing 2-3 in. open toe heels more recently.  Pt helping to plan her daughter's wedding on Feb 3rd, 2023.  Past Medical History:  Diagnosis Date   Clotting disorder ([place])    Hypertension     No Known Allergies  ROS General: Denies fever, chills, night sweats, changes in weight, changes in appetite HEENT: Denies headaches, ear pain, changes in vision, rhinorrhea, sore throat CV: Denies CP, palpitations, SOB, orthopnea Pulm: Denies SOB, cough, wheezing GI: Denies abdominal pain, nausea, vomiting, diarrhea, constipation GU: Denies dysuria, hematuria, frequency, vaginal discharge Msk: Denies muscle cramps, joint pains Neuro: Denies weakness, numbness, tingling Skin: Denies rashes, bruising + bump on bottom of left foot Psych: Denies depression, anxiety, hallucinations     Objective:    Blood pressure 120/82, pulse 81, temperature 98.2 F (36.8 C), temperature source Oral, weight 261 lb (118.4 kg), SpO2 96 %.  Gen. Pleasant, well-nourished, in no distress, normal affect   HEENT: [place]/AT, face symmetric, conjunctiva clear, no scleral icterus, PERRLA, EOMI, nares patent without drainage Lungs: no accessory muscle use Cardiovascular: RRR, no peripheral edema Musculoskeletal: No deformities, no cyanosis or clubbing, normal tone Neuro:  A&Ox3, CN II-XII intact, normal gait Skin:  Warm, no lesions/ rash.  A 1.2  cm round cystic like lesion superficially located in dorsum of L foot.   Wt Readings from Last 3 Encounters:  02/17/21 261 lb (118.4 kg)  08/23/20 268 lb (121.6 kg)  04/05/20 276 lb (125.2 kg)    Lab Results  Component Value Date   WBC 7.5 04/05/2020   HGB 12.8 04/05/2020   HCT 40.4 04/05/2020   PLT 355 04/05/2020   GLUCOSE 103 (H) 04/05/2020   CHOL 159 04/05/2020   TRIG 112 04/05/2020   HDL 51 04/05/2020   LDLCALC 87 04/05/2020   ALT 21 04/05/2020   AST 15 04/05/2020   NA 145 04/05/2020   K 4.5 04/05/2020   CL 105 04/05/2020   CREATININE 0.83 04/05/2020   BUN 17 04/05/2020   CO2 32 04/05/2020   TSH 1.10 04/05/2020   HGBA1C 6.1 (H) 04/05/2020    Assessment/Plan:  Ganglion cyst of left foot  -Discussed treatment options including drainage versus excision -Advised area may return after removal - Plan: Ambulatory referral to Podiatry  Bilateral lower extremity edema  - Plan: chlorthalidone (HYGROTON) 25 MG tablet  Essential hypertension  -Controlled -Continue lifestyle modifications -Continue chlorthalidone 25 mg daily - Plan: chlorthalidone (HYGROTON) 25 MG tablet  Need for immunization against influenza -Influenza vaccine given this visit  F/u prn  Grier Mitts, MD

## 2021-02-17 NOTE — Addendum Note (Signed)
Addended by: Anderson Malta on: 02/17/2021 11:41 AM   Modules accepted: Orders

## 2021-02-18 ENCOUNTER — Ambulatory Visit: Payer: BC Managed Care – PPO | Admitting: Podiatrist

## 2021-02-18 ENCOUNTER — Other Ambulatory Visit: Payer: Self-pay

## 2021-02-18 DIAGNOSIS — M729 Fibroblastic disorder, unspecified: Secondary | ICD-10-CM | POA: Diagnosis not present

## 2021-02-18 DIAGNOSIS — M722 Plantar fascial fibromatosis: Secondary | ICD-10-CM

## 2021-02-18 NOTE — Progress Notes (Signed)
Chief Complaint  Patient presents with   Ganglion Cyst    PT states she was referred by her doctor for a left foot ganglion cyst. Pt states that it has been present for a week and is painful.     HPI: Patient is 55 y.o. female who presents today for a painful knot on the bottom of her left foot. She relates she has been walking more and recently noticed a painful lump on the bottom of her foot.  Her primary care physician referred her to have it evaluated.    Patient Active Problem List   Diagnosis Date Noted   Prediabetes 04/06/2020   Pulmonary embolism without acute cor pulmonale ([place]) 02/09/2016   Hypertension 02/09/2016   Hypokalemia 02/09/2016   Obesity 04/01/2012   Tobacco abuse 04/01/2012   HEADACHE, TENSION 06/17/2010   ANKLE EDEMA 06/17/2010   OTITIS MEDIA, ACUTE 03/18/2010   ACUTE BRONCHITIS 03/18/2010   VOMITING 03/18/2010   URI 03/15/2009   ADVERSE DRUG REACTION 08/18/2008   PLANTAR FASCIITIS, RIGHT 08/14/2008   GASTROENTERITIS 03/20/2008    Current Outpatient Medications on File Prior to Visit  Medication Sig Dispense Refill   chlorthalidone (HYGROTON) 25 MG tablet TAKE 1 TABLET(25 MG) BY MOUTH DAILY 90 tablet 1   cyclobenzaprine (FLEXERIL) 5 MG tablet Take 1 tablet (5 mg total) by mouth at bedtime as needed for muscle spasms. (Patient not taking: Reported on 02/17/2021) 20 tablet 0   Multiple Vitamin (MULTIVITAMIN) tablet Take 1 tablet by mouth daily.     nicotine polacrilex (NICORETTE) 4 MG gum Take 1 each (4 mg total) by mouth as needed for smoking cessation. 50 tablet 3   OVER THE COUNTER MEDICATION Fish oil, 1000 mg one capsule daily.     potassium chloride SA (KLOR-CON) 20 MEQ tablet Take 1 tablet (20 mEq total) by mouth daily. 30 tablet 1   Current Facility-Administered Medications on File Prior to Visit  Medication Dose Route Frequency Provider Last Rate Last Admin   0.9 %  sodium chloride infusion  500 mL Intravenous Once Mansouraty, Telford Nab., MD         No Known Allergies  Review of Systems No fevers, chills, nausea, muscle aches, no difficulty breathing, no calf pain, no chest pain or shortness of breath.   Physical Exam  GENERAL APPEARANCE: Alert, conversant. Appropriately groomed. No acute distress.   VASCULAR: Pedal pulses palpable DP and PT bilateral.  Capillary refill time is immediate to all digits,  Proximal to distal cooling it warm to warm.  Digital perfusion adequate.   NEUROLOGIC: sensation is intact to 5.07 monofilament at 5/5 sites bilateral.  Light touch is intact bilateral, vibratory sensation intact bilateral  MUSCULOSKELETAL: acceptable muscle strength, tone and stability bilateral.  No gross boney pedal deformities noted.  No pain, crepitus or limitation noted with foot and ankle range of motion bilateral.   Dense nodule palpated in the distal 1/3 of the medial band of the plantar fascia.  It measures approxmiately 54m in diameter and is easily palpated.  Pain with palpation also noted within the plantar fascial medial band.    DERMATOLOGIC: skin is warm, supple, and dry.  No open lesions noted.  No rash, no pre ulcerative lesions. Digital nails are asymptomatic.      Assessment   1. Fibromatosis   2. Plantar fibromatosis      Plan  Discussed exam findings with the patient and discussed I believe this to be a benign fibroma within the plantar fascia causing  her pain.    I also recommended a steroid injection into the fibroma to try and shrink the inflamed tissue and decrease the pain she is experiencing.  The patient agreed and the skin was prepped with alcohol.  An injection of '10mg'$  kenalog and marcaine plain was infiltrated into the lesion without complication.  The patient did very well with the injection.  I discussed these nodules sometimes need more than 1 injection and recommended she call if the pain and nodule are still present after 3-4 weeks.  Also discussed a prescription verapamil cream should  she wish to try a topical option as well.  She will call if she needs a follow up appointment or to request the cream be called in for her.

## 2021-02-18 NOTE — Patient Instructions (Addendum)
   Plantar fibroma  Signs & Symptoms The characteristic sign of a plantar fibroma is a noticeable lump in the arch that feels firm to the touch. This mass can remain the same size or get larger over time or additional fibromas may develop.  People who have a plantar fibroma may or may not have pain. When pain occurs, it is often caused by shoes pushing against the lump in the arch, although it can also arise when walking or standing barefoot.  Diagnosis To diagnose a plantar fibroma, the foot and ankle surgeon will examine the foot and press on the affected area. Sometimes this can produce pain that extends down to the toes. An MRI or biopsy may be performed to further evaluate the lump and aid in diagnosis.  Treatment Options Nonsurgical treatment may help relieve the pain of a plantar fibroma, although it will not make the mass disappear. The foot and ankle surgeon may select one or more of the following nonsurgical options:  Steroid injections. Injecting corticosteroid medication into the mass may help shrink it and thereby relieve the pain that occurs when walking. This reduction may only be temporary and the fibroma could slowly return to its original size. Orthotic devices. If the fibroma is stable, meaning it is not changing in size, custom orthotic devices (shoe inserts) may relieve the pain by distributing the patient's weight away from the fibroma. Physical therapy. The pain is sometimes treated through physical therapy methods that deliver anti-inflammatory medication into the fibroma without the need for injection.   If the mass increases in size or pain, the patient should be further evaluated.  Surgical treatment to remove the fibroma is considered if the patient continues to experience pain following nonsurgical approaches. Surgical removal of a plantar fibroma may result in a flattening of the arch or development of hammertoes. Orthotic devices may be prescribed to provide support  to the foot. Due to the high incidence of recurrence with this condition, continued follow-up with the foot and ankle surgeon is recommended.

## 2021-02-21 ENCOUNTER — Encounter: Payer: Self-pay | Admitting: Podiatrist

## 2021-05-02 ENCOUNTER — Encounter: Payer: Self-pay | Admitting: Family Medicine

## 2021-05-02 ENCOUNTER — Other Ambulatory Visit: Payer: Self-pay | Admitting: Family Medicine

## 2021-05-02 ENCOUNTER — Ambulatory Visit (INDEPENDENT_AMBULATORY_CARE_PROVIDER_SITE_OTHER): Payer: BC Managed Care – PPO | Admitting: Family Medicine

## 2021-05-02 VITALS — BP 128/96 | HR 62 | Temp 98.2°F | Ht 67.0 in | Wt 267.8 lb

## 2021-05-02 DIAGNOSIS — Z1231 Encounter for screening mammogram for malignant neoplasm of breast: Secondary | ICD-10-CM

## 2021-05-02 DIAGNOSIS — K5900 Constipation, unspecified: Secondary | ICD-10-CM

## 2021-05-02 DIAGNOSIS — Z1159 Encounter for screening for other viral diseases: Secondary | ICD-10-CM

## 2021-05-02 DIAGNOSIS — I10 Essential (primary) hypertension: Secondary | ICD-10-CM | POA: Diagnosis not present

## 2021-05-02 DIAGNOSIS — R7303 Prediabetes: Secondary | ICD-10-CM

## 2021-05-02 DIAGNOSIS — Z0000 Encounter for general adult medical examination without abnormal findings: Secondary | ICD-10-CM | POA: Diagnosis not present

## 2021-05-02 LAB — BASIC METABOLIC PANEL
BUN: 11 mg/dL (ref 6–23)
CO2: 31 mEq/L (ref 19–32)
Calcium: 9.2 mg/dL (ref 8.4–10.5)
Chloride: 106 mEq/L (ref 96–112)
Creatinine, Ser: 0.81 mg/dL (ref 0.40–1.20)
GFR: 82.4 mL/min (ref >60.00)
Glucose, Bld: 80 mg/dL (ref 70–99)
Potassium: 4.2 mEq/L (ref 3.5–5.1)
Sodium: 142 mEq/L (ref 135–145)

## 2021-05-02 LAB — CBC WITH DIFFERENTIAL/PLATELET
Basophils Absolute: 0 10*3/uL (ref 0.0–0.1)
Basophils Relative: 0.5 % (ref 0.0–3.0)
Eosinophils Absolute: 0.3 10*3/uL (ref 0.0–0.7)
Eosinophils Relative: 4.3 % (ref 0.0–5.0)
HCT: 40.4 % (ref 36.0–46.0)
Hemoglobin: 13.3 g/dL (ref 12.0–15.0)
Lymphocytes Relative: 47.1 % — ABNORMAL HIGH (ref 12.0–46.0)
Lymphs Abs: 3.7 10*3/uL (ref 0.7–4.0)
MCHC: 33 g/dL (ref 30.0–36.0)
MCV: 92.7 fl (ref 78.0–100.0)
Monocytes Absolute: 0.4 10*3/uL (ref 0.1–1.0)
Monocytes Relative: 5.4 % (ref 3.0–12.0)
Neutro Abs: 3.3 10*3/uL (ref 1.4–7.7)
Neutrophils Relative %: 42.7 % — ABNORMAL LOW (ref 43.0–77.0)
Platelets: 332 10*3/uL (ref 150.0–400.0)
RBC: 4.36 Mil/uL (ref 3.87–5.11)
RDW: 14.4 % (ref 11.5–15.5)
WBC: 7.8 10*3/uL (ref 4.0–10.5)

## 2021-05-02 LAB — HEMOGLOBIN A1C: Hgb A1c MFr Bld: 5.9 % (ref 4.6–6.5)

## 2021-05-02 LAB — LIPID PANEL
Cholesterol: 161 mg/dL (ref 0–200)
HDL: 55.3 mg/dL (ref >39.00)
LDL Cholesterol: 91 mg/dL (ref 0–99)
NonHDL: 106.18
Total CHOL/HDL Ratio: 3
Triglycerides: 76 mg/dL (ref 0.0–149.0)
VLDL: 15.2 mg/dL (ref 0.0–40.0)

## 2021-05-02 LAB — TSH: TSH: 1.1 u[IU]/mL (ref 0.35–5.50)

## 2021-05-02 LAB — T4, FREE: Free T4: 0.9 ng/dL (ref 0.60–1.60)

## 2021-05-02 NOTE — Patient Instructions (Signed)
You should start checking your blood pressure at home daily.  If you do not have a blood pressure cuff you can obtain one at your local pharmacy, [facility], [facility], or online.  Try to get a cuff that goes up on your arm and not on your wrist.  Increase your intake of water.  We will have you return to clinic in the next 4 to 6 weeks to recheck your blood pressure.

## 2021-05-02 NOTE — Progress Notes (Signed)
Subjective:     Patricia [name] is a 55 y.o. female and is here for a comprehensive physical exam. Pt doing well.  Having issues with constipation.  Using metamucil regularly.  May have a BM 3 times per week.  States stools are not hard and not really having to strain.  Drinking 32 ounces of water per day.  In the past tried MiraLAX off and on.  Patient has yet to schedule mammogram.  Colonoscopy and Pap up-to-date.  Patient unsure why BP elevated this visit.  Patient unsure if she took her blood pressure medication this morning.  Denies eating extra salty foods, headaches, dizziness, CP, changes in vision.  Pt inquires if she needs a thyroid COVID booster as she is going on a cruise for years.  Patient had her second COVID booster in July. Social History   Socioeconomic History   Marital status: Married    Spouse name: Not on file   Number of children: Not on file   Years of education: Not on file   Highest education level: Not on file  Occupational History   Not on file  Tobacco Use   Smoking status: Former    Packs/day: 0.30    Types: Cigarettes    Quit date: 06/21/2020    Years since quitting: 0.8   Smokeless tobacco: Never   Tobacco comments:    using electronic cigarettes too  Substance and Sexual Activity   Alcohol use: Yes    Comment: occasional   Drug use: No   Sexual activity: Not on file  Other Topics Concern   Not on file  Social History Narrative   Not on file   Social Determinants of Health   Financial Resource Strain: Not on file  Food Insecurity: Not on file  Transportation Needs: Not on file  Physical Activity: Not on file  Stress: Not on file  Social Connections: Not on file  Intimate Partner Violence: Not on file   Health Maintenance  Topic Date Due   HIV Screening  Never done   Hepatitis C Screening  Never done   PAP SMEAR-Modifier  Never done   Zoster Vaccines- Shingrix (1 of 2) Never done   TETANUS/TDAP  08/15/2018   COVID-19 Vaccine (3 -  Booster for Janssen series) 02/16/2021   MAMMOGRAM  06/10/2021 (Originally 08/18/2020)   COLONOSCOPY (Pts 45-27yrs Insurance coverage will need to be confirmed)  03/19/2028   INFLUENZA VACCINE  Completed   Pneumococcal Vaccine 30-59 Years old  Aged Out   HPV VACCINES  Aged Out    The following portions of the patient's history were reviewed and updated as appropriate: allergies, current medications, past family history, past medical history, past social history, past surgical history, and problem list.  Review of Systems Pertinent items noted in HPI and remainder of comprehensive ROS otherwise negative.   Objective:    BP (!) 128/96 (BP Location: Left Arm, Patient Position: Sitting, Cuff Size: Normal)   Pulse 62   Temp 98.2 F (36.8 C) (Oral)   Ht 5\' 7"  (1.702 m)   Wt 267 lb 12.8 oz (121.5 kg)   SpO2 98%   BMI 41.94 kg/m  General appearance: alert, cooperative, and no distress Head: Normocephalic, without obvious abnormality, atraumatic Eyes: conjunctivae/corneas clear. PERRL, EOM's intact. Fundi benign. Ears: normal TM's and external ear canals both ears Nose: Nares normal. Septum midline. Mucosa normal. No drainage or sinus tenderness. Throat: lips, mucosa, and tongue normal; teeth and gums normal Neck: no adenopathy, no  carotid bruit, no JVD, supple, symmetrical, trachea midline, and thyroid not enlarged, symmetric, no tenderness/mass/nodules Lungs: clear to auscultation bilaterally Heart: regular rate and rhythm, S1, S2 normal, no murmur, click, rub or gallop Abdomen: soft, non-tender; bowel sounds normal; no masses,  no organomegaly Extremities: extremities normal, atraumatic, no cyanosis or edema Pulses: 2+ and symmetric Skin: Skin color, texture, turgor normal. No rashes or lesions Lymph nodes: Cervical, supraclavicular, and axillary nodes normal. Neurologic: Alert and oriented X 3, normal strength and tone. Normal symmetric reflexes. Normal coordination and gait     Assessment:    Healthy female exam.     Plan:    Anticipatory guidance given including wearing seatbelts, smoke detectors in the home, increasing physical activity, increasing p.o. intake of water and vegetables. -Obtain labs -Patient given information and encouraged to schedule mammogram -Immunizations reviewed Colonoscopy up-to-date.  Done 03/19/2018 -Given handout -Next CPE in 1 year See After Visit Summary for Counseling Recommendations   Essential hypertension  -Initially uncontrolled.  Recheck 128/96. -Unclear if patient took medication this morning -Continue chlorthalidone 25 mg daily -Patient encouraged to obtain BP cuff to monitor BP at home. -We will have patient follow-up next few weeks for BP check. - Plan: Basic metabolic panel, TSH, T4, Free  Prediabetes -Hemoglobin A1c 6.1% on 04/05/2020 -Lifestyle modifications - Plan: Hemoglobin A1c, Lipid panel  Encounter for hepatitis C screening test for low risk patient  - Plan: Hep C Antibody  Constipation, unspecified constipation type -Discussed increasing p.o. intake of water and other lifestyle medications -Okay to use MiraLAX daily or other daily bowel regimen -Given handout -Patient to notify clinic for worsened symptoms -Continue to monitor  Follow-up in 1-2 months for HTN  Grier Mitts, MD

## 2021-05-03 LAB — HEPATITIS C ANTIBODY
Hepatitis C Ab: NONREACTIVE
SIGNAL TO CUT-OFF: 0.04 (ref <1.00)

## 2021-05-09 ENCOUNTER — Ambulatory Visit
Admission: RE | Admit: 2021-05-09 | Discharge: 2021-05-09 | Disposition: A | Discharge status: Home / Self Care | Payer: BC Managed Care – PPO | Source: Ambulatory Visit

## 2021-05-09 DIAGNOSIS — Z1231 Encounter for screening mammogram for malignant neoplasm of breast: Secondary | ICD-10-CM

## 2021-06-23 ENCOUNTER — Other Ambulatory Visit: Payer: Self-pay

## 2021-06-23 ENCOUNTER — Ambulatory Visit (INDEPENDENT_AMBULATORY_CARE_PROVIDER_SITE_OTHER): Payer: BC Managed Care – PPO

## 2021-06-23 ENCOUNTER — Ambulatory Visit: Payer: BC Managed Care – PPO | Admitting: Family Medicine

## 2021-06-23 VITALS — BP 118/64 | HR 84 | Temp 98.9°F | Wt 260.0 lb

## 2021-06-23 DIAGNOSIS — M25571 Pain in right ankle and joints of right foot: Secondary | ICD-10-CM

## 2021-06-23 DIAGNOSIS — W19XXXD Unspecified fall, subsequent encounter: Secondary | ICD-10-CM

## 2021-06-23 DIAGNOSIS — M545 Low back pain, unspecified: Secondary | ICD-10-CM

## 2021-06-23 DIAGNOSIS — M25471 Effusion, right ankle: Secondary | ICD-10-CM

## 2021-06-23 DIAGNOSIS — M25551 Pain in right hip: Secondary | ICD-10-CM

## 2021-06-23 DIAGNOSIS — M25552 Pain in left hip: Secondary | ICD-10-CM | POA: Diagnosis not present

## 2021-06-23 NOTE — Progress Notes (Signed)
Subjective:    Patient ID: Patricia [name], female    DOB: 17-May-1966, 56 y.o.   MRN: [id number]  Chief Complaint  Patient presents with   Ankle Pain    Right ankle pain and lower back pain from fall down stairs. Was on last step. Was given morphine, and Rx 400 mg ibuprofen.    HPI Patient was seen today for acute concern.  Patient endorses while going downstairs cruise and 06/13/2021.  Patient states she landed on her back/right side.  Endorses bilateral hip pain, bruising on but right ankle pain and edema.  X-rays done onboard the cruise were difficult to read/not of the best quality.  Given a dose of pain medicine on board cruise.  Patient tried to go to a hospital in [place], [place] but was not seen.  Since return from trip,  pt with continued right ankle edema, 6-7/10 pain, 8-9/10 at worse when applying pressure to the foot and with palpation.  Ankle pain is not described as pulsating.  Having pain in midline lower back making it difficult to lay flat at night.  Bilateral hips with a burning pain/pulling sensation.  Tried ibuprofen 400 mg.  Past Medical History:  Diagnosis Date   Clotting disorder ([place])    Hypertension     No Known Allergies  ROS General: Denies fever, chills, night sweats, changes in weight, changes in appetite HEENT: Denies headaches, ear pain, changes in vision, rhinorrhea, sore throat CV: Denies CP, palpitations, SOB, orthopnea Pulm: Denies SOB, cough, wheezing GI: Denies abdominal pain, nausea, vomiting, diarrhea, constipation GU: Denies dysuria, hematuria, frequency, vaginal discharge Msk: Denies muscle cramps  + midline low back pain, bilateral hip pain, right ankle pain and edema Neuro: Denies weakness, numbness, tingling Skin: Denies rashes, bruising Psych: Denies depression, anxiety, hallucinations      Objective:    Blood pressure 118/64, pulse 84, temperature 98.9 F (37.2 C), temperature source Oral, weight 260 lb (117.9 kg), SpO2 99 %.  Gen.  Pleasant, well-nourished, in no distress, normal affect   HEENT: [place]/AT, face symmetric, conjunctiva clear, no scleral icterus, PERRLA, EOMI, nares patent without drainag Lungs: no accessory muscle use, CTAB, no wheezes or rales Cardiovascular: RRR, no m/r/g, no peripheral edema Abdomen: BS present, soft, NT/ND, no hepatosplenomegaly. Musculoskeletal: No TTP of cervical, thoracic, or paraspinal muscles.  TTP of lumbosacral spine midline.  TTP of bilateral hips.  Negative logroll on the right.  Positive straight leg raise on the right.  Pain in right hip with 80 degrees of abduction.  Positive FABER and negative FADIR on right.  Negative logroll on left, negative straight leg raise on left, positive FADIR and FABER on left.  Right ankle lateral edema with TTP.  Pain with right foot inversion.  No deformities, no cyanosis or clubbing, normal tone Neuro:  A&Ox3, CN II-XII intact, normal gait Skin:  Warm, no lesions/ rash   Wt Readings from Last 3 Encounters:  06/23/21 260 lb (117.9 kg)  05/02/21 267 lb 12.8 oz (121.5 kg)  02/17/21 261 lb (118.4 kg)    Lab Results  Component Value Date   WBC 7.8 05/02/2021   HGB 13.3 05/02/2021   HCT 40.4 05/02/2021   PLT 332.0 05/02/2021   GLUCOSE 80 05/02/2021   CHOL 161 05/02/2021   TRIG 76.0 05/02/2021   HDL 55.30 05/02/2021   LDLCALC 91 05/02/2021   ALT 21 04/05/2020   AST 15 04/05/2020   NA 142 05/02/2021   K 4.2 05/02/2021   CL 106 05/02/2021  CREATININE 0.81 05/02/2021   BUN 11 05/02/2021   CO2 31 05/02/2021   TSH 1.10 05/02/2021   HGBA1C 5.9 05/02/2021    Assessment/Plan:  Bilateral hip pain - Plan: DG HIPS BILAT WITH PELVIS 3-4 VIEWS  Acute right ankle pain - Plan: DG Ankle Complete Right  Edema of right ankle - Plan: DG Ankle Complete Right  Back pain, lumbosacral - Plan: DG HIPS BILAT WITH PELVIS 3-4 VIEWS  Fall from standing -Fall prevention  Symptoms likely 2/2 musculoskeletal strain and right ankle sprain.  Will obtain  imaging to rule out fracture.  Discussed treatment of symptoms with heat, compression, gentle range of motion exercises, NSAIDs or Tylenol as needed.  Consider muscle relaxer.  Further recommendations based on imaging.  F/u as needed for continued or worsening symptoms  Grier Mitts, MD

## 2021-06-24 ENCOUNTER — Encounter: Payer: Self-pay | Admitting: Family Medicine

## 2021-10-08 ENCOUNTER — Other Ambulatory Visit: Payer: Self-pay | Admitting: Family Medicine

## 2021-10-08 DIAGNOSIS — R600 Localized edema: Secondary | ICD-10-CM

## 2021-10-08 DIAGNOSIS — I10 Essential (primary) hypertension: Secondary | ICD-10-CM

## 2021-10-21 ENCOUNTER — Other Ambulatory Visit (HOSPITAL_COMMUNITY)
Admission: RE | Admit: 2021-10-21 | Discharge: 2021-10-21 | Disposition: A | Discharge status: Home / Self Care | Payer: BC Managed Care – PPO | Source: Ambulatory Visit | Attending: Radiology | Admitting: Radiology

## 2021-10-21 ENCOUNTER — Ambulatory Visit: Payer: BC Managed Care – PPO | Admitting: Radiology

## 2021-10-21 ENCOUNTER — Encounter: Payer: Self-pay | Admitting: Radiology

## 2021-10-21 VITALS — BP 112/74 | HR 70 | Ht 65.25 in | Wt 263.0 lb

## 2021-10-21 DIAGNOSIS — N958 Other specified menopausal and perimenopausal disorders: Secondary | ICD-10-CM | POA: Diagnosis not present

## 2021-10-21 DIAGNOSIS — N951 Menopausal and female climacteric states: Secondary | ICD-10-CM

## 2021-10-21 DIAGNOSIS — Z01419 Encounter for gynecological examination (general) (routine) without abnormal findings: Secondary | ICD-10-CM

## 2021-10-21 MED ORDER — IMVEXXY MAINTENANCE PACK 10 MCG VA INST: 1.0000 | VAGINAL_INSERT | VAGINAL | 4 refills | Status: DC

## 2021-10-21 MED ORDER — FLUOXETINE HCL 10 MG PO CAPS: 10.0000 mg | ORAL_CAPSULE | Freq: Every day | ORAL | 4 refills | Status: DC

## 2021-10-21 NOTE — Progress Notes (Signed)
? ?  Patricia [name] Jan 14, 1966 [id number] ? ? ?History:  56 y.o. G2P2 presents for annual exam. C/o vaginal dryness, hot flashes, trouble sleeping and weepiness. Hx of DVT/PE. ? ?Gynecologic History ?Hysterectomy: menorrhagia, age 35  ?Sexually active: yes, painful ? ?Health Maintenance ?Last Pap: 2010. Results were: normal ?Last mammogram: 11/22. Results were: normal ?Last colonoscopy: 2019. Results were: normal ?Last Dexa: never  ? ?Past medical history, past surgical history, family history and social history were all reviewed and documented in the EPIC chart. ? ?ROS:  A ROS was performed and pertinent positives and negatives are included. ? ?Exam: ? ?Vitals:  ? 10/21/21 0911  ?BP: 112/74  ?Pulse: 70  ?SpO2: 99%  ?Weight: 263 lb (119.3 kg)  ?Height: 5' 5.25" (1.657 m)  ? ?Body mass index is 43.43 kg/m?. ? ?General appearance:  Normal, obese ?Thyroid:  Symmetrical, normal in size, without palpable masses or nodularity. ?Respiratory ? Auscultation:  Clear without wheezing or rhonchi ?Cardiovascular ? Auscultation:  Regular rate, without rubs, murmurs or gallops ? Edema/varicosities:  Not grossly evident ?Abdominal ? Soft,nontender, without masses, guarding or rebound. ? Liver/spleen:  No organomegaly noted ? Hernia:  None appreciated ? Skin ? Inspection:  Grossly normal ?Breasts: Examined lying and sitting. Reduction scarring present ? Right: Without masses, retractions, nipple discharge or axillary adenopathy. ? ? Left: Without masses, retractions, nipple discharge or axillary adenopathy. ?Genitourinary  ? Inguinal/mons:  Normal without inguinal adenopathy ? External genitalia:  Normal appearing vulva with no masses, tenderness, or lesions ? BUS/Urethra/Skene's glands:  Normal ? Vagina:  Normal appearing with normal color and discharge, no lesions. Atrophy moderate ? Cervix:  absent ? Uterus:  absent ? Adnexa/parametria:   ?  Rt: Normal in size, without masses or tenderness. ?  Lt: Normal in size, without masses  or tenderness. ? Anus and perineum: Normal ? Digital rectal exam: Normal sphincter tone without palpated masses or tenderness ? ?Patient informed chaperone available to be present for breast and pelvic exam. Patient has requested no chaperone to be present. Patient has been advised what will be completed during breast and pelvic exam.  ? ?Assessment/Plan:   ?1. Well woman exam with routine gynecological exam ? ?- Cytology - PAP( [place]) ? ?2. Genitourinary syndrome of menopause ? ?- Estradiol (IMVEXXY MAINTENANCE PACK) 10 MCG INST; Place 1 tablet vaginally 2 (two) times a week.  Dispense: 24 each; Refill: 4 ? ?3. Vasomotor symptoms due to menopause ? ?- FLUoxetine (PROZAC) 10 MG capsule; Take 1 capsule (10 mg total) by mouth at bedtime.  Dispense: 90 capsule; Refill: 4  ? ? ?Discussed SBE, colonoscopy and DEXA screening as appropriate. Encouraged 116mns/week of cardiovascular and weight bearing exercise minimum. Recommend the use of seatbelts and sunscreen consistently.  ? ?Return in 1 year for annual or sooner prn. ? ?Kerry DoryWHNP-BC 9:41 AM 10/21/2021  ?

## 2021-10-25 LAB — CYTOLOGY - PAP
Comment: NEGATIVE
Diagnosis: NEGATIVE
High risk HPV: NEGATIVE

## 2021-10-25 MED ORDER — FLUCONAZOLE 150 MG PO TABS
150.0000 mg | ORAL_TABLET | Freq: Once | ORAL | 0 refills | Status: AC
Start: 2021-10-25 — End: 2021-10-25

## 2021-10-25 NOTE — Telephone Encounter (Signed)
Patient replied to [facility] my chart message  ? ?Patricia [name], your pap test returned negative. We still like to see you yearly for your annual exam but we wont need to repeat the pap test for another 3 years. The pap test did detect some yeast was present in the vagina. If you would like, we can send in a medication called diflucan to your pharmacy to treat for you. We have a few pharmacies on file so if you would like the medication, let us know and also confirm the pharmacy you would like it sent to. Thanks. ?

## 2021-11-01 ENCOUNTER — Telehealth: Payer: Self-pay | Admitting: *Deleted

## 2021-11-01 NOTE — Telephone Encounter (Signed)
PA done online via cover my meds for Imvexxy pack 10 mcg tablet. Medication denied by [facility] patient will need to try/fail generic vagifem. Can Rx be sent for generic vagifem? Please advise

## 2021-11-08 NOTE — Telephone Encounter (Signed)
Ok to send vagifem

## 2021-11-08 NOTE — Telephone Encounter (Signed)
Left message for patient to call.

## 2021-11-14 MED ORDER — ESTRADIOL 10 MCG VA TABS: 1.0000 | ORAL_TABLET | VAGINAL | 3 refills | Status: AC

## 2021-11-14 NOTE — Telephone Encounter (Signed)
Patient informed. Rx sent 

## 2021-11-25 ENCOUNTER — Telehealth: Payer: BC Managed Care – PPO | Admitting: Physician Assistant

## 2021-11-25 DIAGNOSIS — H9209 Otalgia, unspecified ear: Secondary | ICD-10-CM

## 2021-11-25 DIAGNOSIS — K047 Periapical abscess without sinus: Secondary | ICD-10-CM | POA: Diagnosis not present

## 2021-11-25 MED ORDER — AMOXICILLIN 500 MG PO CAPS: 500.0000 mg | ORAL_CAPSULE | Freq: Three times a day (TID) | ORAL | 0 refills | Status: AC

## 2021-11-25 NOTE — Progress Notes (Signed)

## 2022-02-28 ENCOUNTER — Telehealth: Payer: Self-pay | Admitting: Family Medicine

## 2022-02-28 ENCOUNTER — Ambulatory Visit (INDEPENDENT_AMBULATORY_CARE_PROVIDER_SITE_OTHER): Payer: BC Managed Care – PPO

## 2022-02-28 DIAGNOSIS — Z23 Encounter for immunization: Secondary | ICD-10-CM

## 2022-02-28 NOTE — Telephone Encounter (Signed)
Patient stopped by and dropped off paperwork needing to be completed for her job. She needs the paperwork completed with her information from last year's physical. Paperwork placed in folder for completion.     Please advise

## 2022-03-02 NOTE — Telephone Encounter (Signed)
Spoke with pt, informed her that the paperwork is completed and will be at the front desk for pick up.

## 2022-03-22 ENCOUNTER — Ambulatory Visit: Payer: Self-pay

## 2022-03-22 ENCOUNTER — Ambulatory Visit: Payer: BC Managed Care – PPO | Admitting: Family Medicine

## 2022-03-22 ENCOUNTER — Encounter: Payer: Self-pay | Admitting: Family Medicine

## 2022-03-22 VITALS — BP 117/78 | Ht 65.25 in | Wt 263.0 lb

## 2022-03-22 DIAGNOSIS — M170 Bilateral primary osteoarthritis of knee: Secondary | ICD-10-CM

## 2022-03-22 DIAGNOSIS — M25561 Pain in right knee: Secondary | ICD-10-CM

## 2022-03-22 MED ORDER — PREDNISONE 5 MG PO TABS: ORAL_TABLET | ORAL | 0 refills | Status: DC

## 2022-03-22 MED ORDER — DICLOFENAC SODIUM 2 % EX SOLN: 2.0000 | Freq: Two times a day (BID) | CUTANEOUS | 2 refills | Status: DC

## 2022-03-22 NOTE — Progress Notes (Signed)
  Patricia [name] - 56 y.o. female MRN [id number]  Date of birth: December 31, 1965  SUBJECTIVE:  Including CC & ROS.  No chief complaint on file.   Patricia [name] is a 56 y.o. female that is presenting with acute on chronic bilateral knee pain.  The pain is lateral in nature.  Has a history of surgery on the left knee.  Has tried over-the-counter medications.    Review of Systems See HPI   HISTORY: Past Medical, Surgical, Social, and Family History Reviewed & Updated per EMR.   Pertinent Historical Findings include:  Past Medical History:  Diagnosis Date   Clotting disorder ([place])    Hypertension     Past Surgical History:  Procedure Laterality Date   ABDOMINAL HYSTERECTOMY     BREAST REDUCTION SURGERY     KNEE SURGERY Left    REDUCTION MAMMAPLASTY Bilateral      PHYSICAL EXAM:  VS: BP 117/78 (BP Location: Left Arm, Patient Position: Sitting)   Ht 5' 5.25" (1.657 m)   Wt 263 lb (119.3 kg)   BMI 43.43 kg/m  Physical Exam Gen: NAD, alert, cooperative with exam, well-appearing MSK:  Neurovascularly intact    Limited ultrasound: Right and left knee:  Left knee: Mild effusion within the suprapatellar pouch. Normal-appearing quadricep tendon. Infrapatellar bursitis appreciated. Degenerative changes of the lateral joint space. Mild degenerative changes of the medial joint space.  Right knee: Mild effusion within the suprapatellar pouch. Normal appearing quadricep tendon. Infrapatellar bursitis appreciated. Degenerative changes of the medial meniscus. Mild degenerative changes of the lateral joint space  Summary: Effusion and degenerative changes appreciated  Ultrasound and interpretation by Clearance Coots, MD    ASSESSMENT & PLAN:   Primary osteoarthritis of both knees Acute on chronic in nature.  Has degenerative changes appreciated bilaterally.  Has a history of left knee surgery. -Counseled on home exercise therapy and supportive  care. -Prednisone. -Green sport insoles. -Could consider physical therapy or injections.

## 2022-03-22 NOTE — Assessment & Plan Note (Signed)
Acute on chronic in nature.  Has degenerative changes appreciated bilaterally.  Has a history of left knee surgery. -Counseled on home exercise therapy and supportive care. -Prednisone. -Green sport insoles. -Could consider physical therapy or injections.

## 2022-03-22 NOTE — Patient Instructions (Signed)
Nice to meet you Please try ice  Please try the exercises  Please try the insoles   Please send me a message in MyChart with any questions or updates.  Please see me back in 4 weeks.   --Dr.   

## 2022-04-21 ENCOUNTER — Encounter: Payer: Self-pay | Admitting: Family Medicine

## 2022-04-21 ENCOUNTER — Ambulatory Visit: Payer: Self-pay

## 2022-04-21 ENCOUNTER — Ambulatory Visit: Payer: BC Managed Care – PPO | Admitting: Family Medicine

## 2022-04-21 VITALS — BP 128/78 | Ht 67.0 in | Wt 260.0 lb

## 2022-04-21 DIAGNOSIS — M170 Bilateral primary osteoarthritis of knee: Secondary | ICD-10-CM

## 2022-04-21 MED ORDER — TRIAMCINOLONE ACETONIDE 40 MG/ML IJ SUSP
40.0000 mg | Freq: Once | INTRAMUSCULAR | Status: AC
  Administered 2022-04-21: 40 mg via INTRA_ARTICULAR

## 2022-04-21 NOTE — Patient Instructions (Signed)
Good to see you Please use ice as needed  Please continue the exercises   Please send me a message in MyChart with any questions or updates.  Please see me back in 6-8 weeks.   --Dr. Raeford Razor

## 2022-04-21 NOTE — Assessment & Plan Note (Signed)
Acute on chronic in nature.  No improvement with previous modalities. -Counseled on home exercise therapy and supportive care. -Bilateral x-rays. -Bilateral injections. -Could consider physical therapy or gel injection.

## 2022-04-21 NOTE — Progress Notes (Signed)
  Patricia [name] - 56 y.o. female MRN [id number]  Date of birth: Oct 31, 1965  SUBJECTIVE:  Including CC & ROS.  No chief complaint on file.   Patricia [name] is a 56 y.o. female that is following up for her knee pain.  The knee pain is currently staying the same.  It is in the medial aspect of each knee.    Review of Systems See HPI   HISTORY: Past Medical, Surgical, Social, and Family History Reviewed & Updated per EMR.   Pertinent Historical Findings include:  Past Medical History:  Diagnosis Date   Clotting disorder ([place])    Hypertension     Past Surgical History:  Procedure Laterality Date   ABDOMINAL HYSTERECTOMY     BREAST REDUCTION SURGERY     KNEE SURGERY Left    REDUCTION MAMMAPLASTY Bilateral      PHYSICAL EXAM:  VS: BP 128/78   Ht '5\' 7"'$  (1.702 m)   Wt 260 lb (117.9 kg)   BMI 40.72 kg/m  Physical Exam Gen: NAD, alert, cooperative with exam, well-appearing MSK:  Neurovascularly intact     Aspiration/Injection Procedure Note SANTIANA [name] 05-14-1966  Procedure: Injection Indications: Right and left knee pain  Procedure Details Consent: Risks of procedure as well as the alternatives and risks of each were explained to the (patient/caregiver).  Consent for procedure obtained. Time Out: Verified patient identification, verified procedure, site/side was marked, verified correct patient position, special equipment/implants available, medications/allergies/relevent history reviewed, required imaging and test results available.  Performed.  The area was cleaned with iodine and alcohol swabs.    The right and left knee superior lateral suprapatellar pouch was injected using 3 cc of 1% lidocaine on a 22-gauge 1-1/2 inch needle.  The syringe was switched and a mixture containing 1 cc's of 40 mg Kenalog and 4 cc's of 0.25% bupivacaine was injected.  Ultrasound was used. Images were obtained in long views showing the injection.     A sterile dressing  was applied.  Patient did tolerate procedure well.     ASSESSMENT & PLAN:   Primary osteoarthritis of both knees Acute on chronic in nature.  No improvement with previous modalities. -Counseled on home exercise therapy and supportive care. -Bilateral x-rays. -Bilateral injections. -Could consider physical therapy or gel injection.

## 2022-04-22 ENCOUNTER — Other Ambulatory Visit: Payer: Self-pay | Admitting: Family Medicine

## 2022-04-22 DIAGNOSIS — R600 Localized edema: Secondary | ICD-10-CM

## 2022-04-22 DIAGNOSIS — I10 Essential (primary) hypertension: Secondary | ICD-10-CM

## 2022-05-22 ENCOUNTER — Encounter: Payer: Self-pay | Admitting: Family Medicine

## 2022-05-22 ENCOUNTER — Ambulatory Visit (INDEPENDENT_AMBULATORY_CARE_PROVIDER_SITE_OTHER): Payer: BC Managed Care – PPO | Admitting: Family Medicine

## 2022-05-22 VITALS — BP 122/66 | HR 70 | Temp 98.3°F | Ht 66.5 in | Wt 270.4 lb

## 2022-05-22 DIAGNOSIS — R0989 Other specified symptoms and signs involving the circulatory and respiratory systems: Secondary | ICD-10-CM

## 2022-05-22 DIAGNOSIS — I10 Essential (primary) hypertension: Secondary | ICD-10-CM | POA: Diagnosis not present

## 2022-05-22 DIAGNOSIS — R252 Cramp and spasm: Secondary | ICD-10-CM

## 2022-05-22 DIAGNOSIS — Z0000 Encounter for general adult medical examination without abnormal findings: Secondary | ICD-10-CM

## 2022-05-22 DIAGNOSIS — R7303 Prediabetes: Secondary | ICD-10-CM

## 2022-05-22 DIAGNOSIS — Z23 Encounter for immunization: Secondary | ICD-10-CM

## 2022-05-22 MED ORDER — HYDROCHLOROTHIAZIDE 12.5 MG PO CAPS: 12.5000 mg | ORAL_CAPSULE | Freq: Every day | ORAL | 3 refills | Status: DC

## 2022-05-22 NOTE — Progress Notes (Signed)
Subjective:     Patricia [name] is a 56 y.o. female and is here for a comprehensive physical exam.  H/o ankle edema improved since being on HCTZ 12.5 mg daily.  Noticed L calf cramp?  Painful sensation but not really like a cramp.  Social History   Socioeconomic History   Marital status: Married    Spouse name: Not on file   Number of children: Not on file   Years of education: Not on file   Highest education level: Not on file  Occupational History   Not on file  Tobacco Use   Smoking status: Former    Packs/day: 0.30    Types: Cigarettes    Quit date: 06/21/2020    Years since quitting: 1.9   Smokeless tobacco: Never   Tobacco comments:    using electronic cigarettes too  Substance and Sexual Activity   Alcohol use: Yes    Comment: occasional   Drug use: No   Sexual activity: Yes    Partners: Male    Birth control/protection: Surgical  Other Topics Concern   Not on file  Social History Narrative   Not on file   Social Determinants of Health   Financial Resource Strain: Not on file  Food Insecurity: Not on file  Transportation Needs: Not on file  Physical Activity: Not on file  Stress: Not on file  Social Connections: Not on file  Intimate Partner Violence: Not on file   Health Maintenance  Topic Date Due   HIV Screening  Never done   COVID-19 Vaccine (3 - 2023-24 season) 02/10/2022   MAMMOGRAM  05/10/2023   PAP SMEAR-Modifier  10/21/2024   COLONOSCOPY (Pts 45-64yr Insurance coverage will need to be confirmed)  03/19/2028   DTaP/Tdap/Td (3 - Td or Tdap) 02/29/2032   INFLUENZA VACCINE  Completed   Hepatitis C Screening  Completed   Zoster Vaccines- Shingrix  Completed   HPV VACCINES  Aged Out    The following portions of the patient's history were reviewed and updated as appropriate: allergies, current medications, past family history, past medical history, past social history, past surgical history, and problem list.  Review of Systems Pertinent  items noted in HPI and remainder of comprehensive ROS otherwise negative.   Objective:    BP 122/66 (BP Location: Left Arm, Patient Position: Sitting, Cuff Size: Large)   Pulse 70   Temp 98.3 F (36.8 C) (Oral)   Ht 5' 6.5" (1.689 m)   Wt 270 lb 6.4 oz (122.7 kg)   SpO2 99%   BMI 42.99 kg/m  General appearance: alert, cooperative, and no distress Head: Normocephalic, without obvious abnormality, atraumatic Eyes: conjunctivae/corneas clear. PERRL, EOM's intact. Fundi benign. Ears: normal TM's and external ear canals both ears Nose: Nares normal. Septum midline. Mucosa normal. No drainage or sinus tenderness. Throat: lips, mucosa, and tongue normal; teeth and gums normal Neck: no adenopathy, R carotid bruit, no left carotid artery bruit, no JVD, supple, symmetrical, trachea midline, and thyroid not enlarged, symmetric, no tenderness/mass/nodules Lungs: clear to auscultation bilaterally Heart: regular rate and rhythm, S1, S2 normal, no murmur, click, rub or gallop Abdomen: soft, non-tender; bowel sounds normal; no masses,  no organomegaly Extremities: extremities normal, atraumatic, no cyanosis or edema Pulses: 2+ and symmetric Skin: Skin color, texture, turgor normal. No rashes or lesions Lymph nodes: Cervical, supraclavicular, and axillary nodes normal. Neurologic: Alert and oriented X 3, normal strength and tone. Normal symmetric reflexes. Normal coordination and gait    Assessment:  Healthy female exam.     Plan:    Anticipatory guidance given including wearing seatbelts, smoke detectors in the home, increasing physical activity, increasing p.o. intake of water and vegetables.  -Labs -Immunizations reviewed -Colonoscopy done 03/19/2018 with recall per GI -Patient to schedule mammogram -Last Pap 10/25/2021 -Given handout -Form completed for service organization participation. See After Visit Summary for Counseling Recommendations   Well adult exam  Need for shingles  vaccine  - Plan: Zoster Recombinant (Shingrix )  Essential hypertension -controlled -Continue current medication HCTZ 12.5 mg daily  - Plan: CBC with Differential/Platelet, TSH, CMP, hydrochlorothiazide (MICROZIDE) 12.5 MG capsule  Leg cramp - Plan: CMP, Magnesium  Prediabetes  -Hemoglobin A1c 5.9% on 05/02/2021 - Plan: Hemoglobin A1c  Bruit of right carotid artery  - Plan: Lipid panel, US Carotid Duplex Bilateral  F/u prn  Grier Mitts, MD

## 2022-05-25 ENCOUNTER — Ambulatory Visit
Admission: RE | Admit: 2022-05-25 | Discharge: 2022-05-25 | Disposition: A | Discharge status: Home / Self Care | Payer: BC Managed Care – PPO | Source: Ambulatory Visit | Attending: Family Medicine | Admitting: Family Medicine

## 2022-05-25 DIAGNOSIS — R0989 Other specified symptoms and signs involving the circulatory and respiratory systems: Secondary | ICD-10-CM

## 2022-07-20 ENCOUNTER — Encounter: Payer: Self-pay | Admitting: Family Medicine

## 2022-07-20 ENCOUNTER — Ambulatory Visit: Payer: BC Managed Care – PPO | Admitting: Family Medicine

## 2022-07-20 VITALS — BP 132/66 | HR 70 | Temp 98.7°F | Ht 66.5 in | Wt 270.2 lb

## 2022-07-20 DIAGNOSIS — H65191 Other acute nonsuppurative otitis media, right ear: Secondary | ICD-10-CM

## 2022-07-20 DIAGNOSIS — H6991 Unspecified Eustachian tube disorder, right ear: Secondary | ICD-10-CM | POA: Diagnosis not present

## 2022-07-20 MED ORDER — FLUTICASONE PROPIONATE 50 MCG/ACT NA SUSP: 1.0000 | Freq: Every day | NASAL | 0 refills | Status: AC

## 2022-07-20 NOTE — Patient Instructions (Signed)
Have included some information about otitis media with effusion.  You do not have the otitis media part which is inflammation/infection of the middle ear, you just have the effusion (the fluid).

## 2022-07-20 NOTE — Progress Notes (Signed)
   Established Patient Office Visit   Subjective  Patient ID: Patricia [name], female    DOB: [birth year]  Age: 57 y.o. MRN: [id number]  Chief Complaint  Patient presents with   Ear Pain   Knee Pain    Patient complains of muffling sound in right ear, x8 weeks    Patient is a 57 year old female who presents today for ongoing concern.  Patient endorses muffled ear symptoms and right ear off-and-on times several weeks.  Patient endorses intermittent viral illnesses since December.  Also had an ear infection for which she was treated with amoxicillin.  Endorses hearing popping and ear, denies pain, increased pressure.  Knee Pain       ROS Negative unless stated above    Objective:     BP 132/66 (BP Location: Left Arm, Patient Position: Sitting, Cuff Size: Normal)   Pulse 70   Temp 98.7 F (37.1 C) (Oral)   Ht 5' 6.5" (1.689 m)   Wt 270 lb 3.2 oz (122.6 kg)   SpO2 97%   BMI 42.96 kg/m    Physical Exam Constitutional:      General: She is not in acute distress.    Appearance: Normal appearance.  HENT:     Head: Normocephalic and atraumatic.     Right Ear: Decreased hearing noted.     Left Ear: Hearing and tympanic membrane normal.     Ears:     Comments: R TM full, effusion in R ear, no erythema.    Nose: Nose normal.     Mouth/Throat:     Mouth: Mucous membranes are moist.  Cardiovascular:     Rate and Rhythm: Normal rate and regular rhythm.     Heart sounds: Normal heart sounds. No murmur heard.    No gallop.  Pulmonary:     Effort: Pulmonary effort is normal. No respiratory distress.     Breath sounds: Normal breath sounds. No wheezing, rhonchi or rales.  Skin:    General: Skin is warm and dry.  Neurological:     Mental Status: She is alert and oriented to person, place, and time.      No results found for any visits on 07/20/22.    Assessment & Plan:  Acute effusion of right ear -     Fluticasone Propionate; Place 1 spray into both nostrils  daily.  Dispense: 16 g; Refill: 0  Dysfunction of right eustachian tube -     Fluticasone Propionate; Place 1 spray into both nostrils daily.  Dispense: 16 g; Refill: 0  Effusion right ear likely 2/2 eustachian tube dysfunction from recent viral illnesses and AOM.  Start antihistamine.  Follow-up for continued or worsening symptoms.  Return if symptoms worsen or fail to improve.   Billie Ruddy, MD

## 2022-09-14 ENCOUNTER — Ambulatory Visit: Payer: BC Managed Care – PPO | Admitting: Family Medicine

## 2022-09-14 ENCOUNTER — Other Ambulatory Visit: Payer: Self-pay

## 2022-09-14 ENCOUNTER — Ambulatory Visit (HOSPITAL_BASED_OUTPATIENT_CLINIC_OR_DEPARTMENT_OTHER)
Admission: RE | Admit: 2022-09-14 | Discharge: 2022-09-14 | Disposition: A | Discharge status: Home / Self Care | Payer: BC Managed Care – PPO | Source: Ambulatory Visit | Attending: Family Medicine | Admitting: Family Medicine

## 2022-09-14 ENCOUNTER — Other Ambulatory Visit (HOSPITAL_BASED_OUTPATIENT_CLINIC_OR_DEPARTMENT_OTHER): Payer: Self-pay | Admitting: Family Medicine

## 2022-09-14 VITALS — BP 138/88 | Ht 67.0 in | Wt 260.0 lb

## 2022-09-14 DIAGNOSIS — M170 Bilateral primary osteoarthritis of knee: Secondary | ICD-10-CM

## 2022-09-14 DIAGNOSIS — M25561 Pain in right knee: Secondary | ICD-10-CM

## 2022-09-14 NOTE — Assessment & Plan Note (Signed)
Acutely worsening.  Exacerbation of her underlying degenerative changes. -Counseled on home exercise therapy and supportive care -Pursue Zilretta injections. -Obtain previously ordered x-rays

## 2022-09-14 NOTE — Progress Notes (Signed)
  Patricia [name] - 57 y.o. female MRN [id number]  Date of birth: [birth year]  SUBJECTIVE:  Including CC & ROS.  No chief complaint on file.   Patricia [name] is a 57 y.o. female that is presenting with acute worsening of her bilateral knee pain.  She has soreness and stiffness when sitting too long.  Has been traveling to [place] to help with her family.  Denies any recent injury or inciting event.    Review of Systems See HPI   HISTORY: Past Medical, Surgical, Social, and Family History Reviewed & Updated per EMR.   Pertinent Historical Findings include:  Past Medical History:  Diagnosis Date   Clotting disorder ([place])    Hypertension     Past Surgical History:  Procedure Laterality Date   ABDOMINAL HYSTERECTOMY     BREAST REDUCTION SURGERY     KNEE SURGERY Left    REDUCTION MAMMAPLASTY Bilateral      PHYSICAL EXAM:  VS: BP 138/88 (BP Location: Left Arm, Patient Position: Sitting)   Ht 5\' 7"  (1.702 m)   Wt 260 lb (117.9 kg)   BMI 40.72 kg/m  Physical Exam Gen: NAD, alert, cooperative with exam, well-appearing MSK:  Neurovascularly intact       ASSESSMENT & PLAN:   Primary osteoarthritis of both knees Acutely worsening.  Exacerbation of her underlying degenerative changes. -Counseled on home exercise therapy and supportive care -Pursue Zilretta injections. -Obtain previously ordered x-rays

## 2022-09-14 NOTE — Patient Instructions (Signed)
Good to see you Please use ice as needed  You can get the xrays that were ordered previously  We'll call with results  We'll call with the benefits of the zilretta   Please send me a message in MyChart with any questions or updates.  Please see me back to have the injection.   --Dr. Raeford Razor

## 2022-09-18 ENCOUNTER — Telehealth: Payer: Self-pay | Admitting: Family Medicine

## 2022-09-18 ENCOUNTER — Telehealth: Payer: Self-pay | Admitting: *Deleted

## 2022-09-18 NOTE — Telephone Encounter (Signed)
Informed of results.   Myra Rude, MD [facility] 09/18/2022, 9:21 AM

## 2022-09-18 NOTE — Telephone Encounter (Signed)
Pt informed of below.  Per [employer] patient owes a $ 60 copay for bilateral Zilretta with the remaining covered at 100%. Deductibles do not apply. Only one copay applies. If OOP Is met, coverage goes to 100%. OV scheduled 09/21/22.  # 2 Zilretta ordered.

## 2022-09-21 ENCOUNTER — Encounter: Payer: Self-pay | Admitting: Family Medicine

## 2022-09-21 ENCOUNTER — Other Ambulatory Visit: Payer: Self-pay

## 2022-09-21 ENCOUNTER — Ambulatory Visit: Payer: BC Managed Care – PPO | Admitting: Family Medicine

## 2022-09-21 VITALS — BP 120/88 | Ht 67.0 in | Wt 260.0 lb

## 2022-09-21 DIAGNOSIS — M170 Bilateral primary osteoarthritis of knee: Secondary | ICD-10-CM | POA: Diagnosis not present

## 2022-09-21 MED ORDER — TRIAMCINOLONE ACETONIDE 32 MG IX SRER
32.0000 mg | Freq: Once | INTRA_ARTICULAR | Status: AC
Start: 2022-09-21 — End: 2022-09-21
  Administered 2022-09-21: 32 mg via INTRA_ARTICULAR

## 2022-09-21 NOTE — Patient Instructions (Signed)
Good to see you Please use ice as needed   Please send me a message in MyChart with any questions or updates.  Please see me back in 3 months or sooner if needed.   --Dr.   

## 2022-09-21 NOTE — Progress Notes (Signed)
  Patricia [name] - 57 y.o. female MRN [id number]  Date of birth: [birth year]  SUBJECTIVE:  Including CC & ROS.  No chief complaint on file.   Patricia [name] is a 57 y.o. female that is  here for bilateral zilretta injections.    Review of Systems See HPI   HISTORY: Past Medical, Surgical, Social, and Family History Reviewed & Updated per EMR.   Pertinent Historical Findings include:  Past Medical History:  Diagnosis Date   Clotting disorder    Hypertension     Past Surgical History:  Procedure Laterality Date   ABDOMINAL HYSTERECTOMY     BREAST REDUCTION SURGERY     KNEE SURGERY Left    REDUCTION MAMMAPLASTY Bilateral      PHYSICAL EXAM:  VS: BP 120/88 (BP Location: Left Arm, Patient Position: Sitting)   Ht 5\' 7"  (1.702 m)   Wt 260 lb (117.9 kg)   BMI 40.72 kg/m  Physical Exam Gen: NAD, alert, cooperative with exam, well-appearing MSK:  Neurovascularly intact     Aspiration/Injection Procedure Note Patricia [name] 06-Jun-1966  Procedure: Injection Indications: right knee pain  Procedure Details Consent: Risks of procedure as well as the alternatives and risks of each were explained to the (patient/caregiver).  Consent for procedure obtained. Time Out: Verified patient identification, verified procedure, site/side was marked, verified correct patient position, special equipment/implants available, medications/allergies/relevent history reviewed, required imaging and test results available.  Performed.  The area was cleaned with iodine and alcohol swabs.    The right knee superior lateral suprapatellar pouch was injected using 4 cc of 1% lidocaine and 0.4 cc of 8.4% sodium bicarbonate on a 21-gauge 2 inch needle.    The syringe was switched and a mixture containing 5 cc's of 32 mg Zilretta and 4 cc's of 0.25% bupivacaine was injected.  Ultrasound was used. Images were obtained in long views showing the injection.    A sterile dressing was  applied.  Patient did tolerate procedure well.  Aspiration/Injection Procedure Note Patricia [name] [birth year]  Procedure: Injection Indications: left knee pain  Procedure Details Consent: Risks of procedure as well as the alternatives and risks of each were explained to the (patient/caregiver).  Consent for procedure obtained. Time Out: Verified patient identification, verified procedure, site/side was marked, verified correct patient position, special equipment/implants available, medications/allergies/relevent history reviewed, required imaging and test results available.  Performed.  The area was cleaned with iodine and alcohol swabs.    The left knee superior lateral suprapatellar pouch was injected using 4 cc of 1% lidocaine and 0.4 cc of 8.4% sodium bicarbonate on a 22-gauge 2 inch needle.  An 18-gauge 1-1/2 needle was used to achieve aspiration.  The syringe was switched and a mixture containing 5 cc's of 32 mg Zilretta and 4 cc's of 0.25% bupivacaine was injected.  Ultrasound was used. Images were obtained in long views showing the injection.    A sterile dressing was applied.  Patient did tolerate procedure well.    ASSESSMENT & PLAN:   Primary osteoarthritis of both knees Completed bilateral Zilretta injections.

## 2022-09-21 NOTE — Assessment & Plan Note (Signed)
Completed bilateral Zilretta injections. 

## 2022-09-25 ENCOUNTER — Encounter: Payer: Self-pay | Admitting: *Deleted

## 2023-01-25 ENCOUNTER — Ambulatory Visit (INDEPENDENT_AMBULATORY_CARE_PROVIDER_SITE_OTHER): Payer: BC Managed Care – PPO | Admitting: Orthopaedic Surgery

## 2023-01-25 DIAGNOSIS — M25562 Pain in left knee: Secondary | ICD-10-CM | POA: Diagnosis not present

## 2023-01-25 DIAGNOSIS — M1712 Unilateral primary osteoarthritis, left knee: Secondary | ICD-10-CM | POA: Diagnosis not present

## 2023-01-25 MED ORDER — LIDOCAINE HCL 1 % IJ SOLN
4.0000 mL | INTRAMUSCULAR | Status: AC | PRN
Start: 2023-01-25 — End: 2023-01-25
  Administered 2023-01-25: 4 mL

## 2023-01-25 MED ORDER — TRIAMCINOLONE ACETONIDE 40 MG/ML IJ SUSP
80.0000 mg | INTRAMUSCULAR | Status: AC | PRN
Start: 2023-01-25 — End: 2023-01-25
  Administered 2023-01-25: 80 mg via INTRA_ARTICULAR

## 2023-01-25 NOTE — Progress Notes (Signed)
Chief Complaint: Left knee pain     History of Present Illness:    Patricia [name] is a 57 y.o. female presents today with ongoing left knee pain with known osteoarthritis.  She has been getting steroid injections most recently getting a Zilretta injection in April 2024 by Dr. Jordan Likes.  She is here today for further discussion.  She does enjoy being active and playing with her grandchildren although this has been quite limited as result of her knee pain.  She does endorse mechanical type popping and clicking in the knee.  This has been more bothersome for last 2 weeks.  She has been taking Aleve which helps.  She is experiencing swelling.  She is experiencing pain throughout the knee.    Surgical History:   none  PMH/PSH/Family History/Social History/Meds/Allergies:    Past Medical History:  Diagnosis Date  . Clotting disorder ([facility])   . Hypertension    Past Surgical History:  Procedure Laterality Date  . ABDOMINAL HYSTERECTOMY    . BREAST REDUCTION SURGERY    . KNEE SURGERY Left   . REDUCTION MAMMAPLASTY Bilateral    Social History   Socioeconomic History  . Marital status: Married    Spouse name: Not on file  . Number of children: Not on file  . Years of education: Not on file  . Highest education level: Not on file  Occupational History  . Not on file  Tobacco Use  . Smoking status: Former    Current packs/day: 0.00    Types: Cigarettes    Quit date: 06/21/2020    Years since quitting: 2.5  . Smokeless tobacco: Never  . Tobacco comments:    using electronic cigarettes too  Substance and Sexual Activity  . Alcohol use: Yes    Comment: occasional  . Drug use: No  . Sexual activity: Yes    Partners: Male    Birth control/protection: Surgical  Other Topics Concern  . Not on file  Social History Narrative  . Not on file   Social Determinants of Health   Financial Resource Strain: Not on file  Food Insecurity: Not on  file  Transportation Needs: Not on file  Physical Activity: Not on file  Stress: Not on file  Social Connections: Not on file   Family History  Problem Relation Age of Onset  . Clotting disorder Mother   . Hypertension Father   . Diabetes Father   . Clotting disorder Sister   . Clotting disorder Brother   . Clotting disorder Maternal Uncle   . Pancreatic cancer Paternal Grandmother   . Deep vein thrombosis Other   . Colon cancer Neg Hx   . Esophageal cancer Neg Hx   . Rectal cancer Neg Hx   . Stomach cancer Neg Hx    No Known Allergies Current Outpatient Medications  Medication Sig Dispense Refill  . Estradiol 10 MCG TABS vaginal tablet Place 1 tablet (10 mcg total) vaginally 2 (two) times a week. 24 tablet 3  . FLUoxetine (PROZAC) 10 MG capsule Take 1 capsule (10 mg total) by mouth at bedtime. 90 capsule 4  . fluticasone (FLONASE) 50 MCG/ACT nasal spray Place 1 spray into both nostrils daily. 16 g 0  . hydrochlorothiazide (MICROZIDE) 12.5 MG capsule Take 1 capsule (12.5 mg total) by mouth daily. 90  capsule 3   No current facility-administered medications for this visit.   No results found.  Review of Systems:   A ROS was performed including pertinent positives and negatives as documented in the HPI.  Physical Exam :   Constitutional: NAD and appears stated age Neurological: Alert and oriented Psych: Appropriate affect and cooperative There were no vitals taken for this visit.   Comprehensive Musculoskeletal Exam:    Tenderness to palpation about tricompartmental joints with positive crepitus.  Range of motion is 0 to 125 degrees.  There is a trace effusion.  Distal neurosensory exam is intact  Imaging:   Xray (4 views left knee): Moderate tricompartmental osteoarthritis    I personally reviewed and interpreted the radiographs.   Assessment:   57 y.o. female with moderate left knee tricompartmental osteoarthritis.  At today's visit she is hoping to have an  additional injection.  That being said these have become increasingly less effective and given that she was open to talk more about knee arthroplasty.  I do ultimately she would be a good candidate for this.  She will think over this and we will plan for possible referral to Dr. Magnus Ivan should she want to pursue further knee arthroplasty  Plan :    -Left knee ultrasound-guided injection performed after verbal consent obtained    Procedure Note  Patient: Patricia [name]             Date of Birth: Oct 02, 1965           MRN: [id number]             Visit Date: 01/25/2023  Procedures: Visit Diagnoses: No diagnosis found.  Large Joint Inj: L knee on 01/25/2023 12:24 PM Indications: pain Details: 22 G 1.5 in needle, ultrasound-guided anterior approach  Arthrogram: No  Medications: 4 mL lidocaine 1 %; 80 mg triamcinolone acetonide 40 MG/ML Outcome: tolerated well, no immediate complications Procedure, treatment alternatives, risks and benefits explained, specific risks discussed. Consent was given by the patient. Immediately prior to procedure a time out was called to verify the correct patient, procedure, equipment, support staff and site/side marked as required. Patient was prepped and draped in the usual sterile fashion.        I personally saw and evaluated the patient, and participated in the management and treatment plan.  Huel Cote, MD Attending Physician, Orthopedic Surgery  This document was dictated using Dragon voice recognition software. A reasonable attempt at proof reading has been made to minimize errors.

## 2023-04-04 ENCOUNTER — Encounter: Payer: Self-pay | Admitting: Family Medicine

## 2023-04-04 ENCOUNTER — Telehealth (INDEPENDENT_AMBULATORY_CARE_PROVIDER_SITE_OTHER): Payer: BC Managed Care – PPO | Admitting: Family Medicine

## 2023-04-04 DIAGNOSIS — R234 Changes in skin texture: Secondary | ICD-10-CM

## 2023-04-04 DIAGNOSIS — L816 Other disorders of diminished melanin formation: Secondary | ICD-10-CM

## 2023-04-04 NOTE — Progress Notes (Signed)
Virtual Visit via Video Note  I connected with Patricia [name] on 04/04/23 at  8:30 AM EDT by a video enabled telemedicine application and verified that I am speaking with the correct person using two identifiers.  Location [name]: home Location provider:work or home office Persons participating in the virtual visit: [name], provider  I discussed the limitations of evaluation and management by telemedicine and the availability of in person appointments. The [name] expressed understanding and agreed to proceed. Chief Complaint  [name] presents with   Abrasion    Left knee the skin is tearing, started over 4 days ago, discoloration,      HPI: [name] is a 57 year old female who was seen virtually for acute concern.  [name] states she noticed a lighter colored area on left knee, 4-5 days ago.  Area is nonpruritic.  States in the past she mentioned to Ortho that the "skin of knee feels like it is stretching".  Endorses lighter color lines on knee with darker color skin in between.  Scaly area on inferior mid knee. Sees veins.  Getting shots in knee.  Can't remember name...thinks its a steroid inj.  Last one was in June.  Trying to avoid TKR if possible.  Putting gold bond, Vaseline, vitamin E.   ROS: See pertinent positives and negatives per HPI.  Past Medical History:  Diagnosis Date   Clotting disorder (HCC)    Hypertension     Past Surgical History:  Procedure Laterality Date   ABDOMINAL HYSTERECTOMY     BREAST REDUCTION SURGERY     KNEE SURGERY Left    REDUCTION MAMMAPLASTY Bilateral     Family History  Problem Relation Age of Onset   Clotting disorder Mother    Hypertension Father    Diabetes Father    Clotting disorder Sister    Clotting disorder Brother    Clotting disorder Maternal Uncle    Pancreatic cancer Paternal Grandmother    Deep vein thrombosis Other    Colon cancer Neg Hx    Esophageal cancer Neg Hx    Rectal cancer Neg Hx    Stomach cancer Neg Hx      Current Outpatient Medications:    Estradiol 10 MCG TABS vaginal tablet, Place 1 tablet (10 mcg total) vaginally 2 (two) times a week., Disp: 24 tablet, Rfl: 3   FLUoxetine (PROZAC) 10 MG capsule, Take 1 capsule (10 mg total) by mouth at bedtime., Disp: 90 capsule, Rfl: 4   fluticasone (FLONASE) 50 MCG/ACT nasal spray, Place 1 spray into both nostrils daily., Disp: 16 g, Rfl: 0   hydrochlorothiazide (MICROZIDE) 12.5 MG capsule, Take 1 capsule (12.5 mg total) by mouth daily., Disp: 90 capsule, Rfl: 3  EXAM:  VITALS per [name] if applicable: RR between 12-20 bpm  GENERAL: alert, oriented, appears well and in no acute distress  HEENT: atraumatic, conjunctiva clear, no obvious abnormalities on inspection of external nose and ears  NECK: normal movements of the head and neck  LUNGS: on inspection no signs of respiratory distress, breathing rate appears normal, no obvious gross SOB, gasping or wheezing  CV: no obvious cyanosis  Skin: A 1-1.5 cm area of hypopigmentation midline inferior left knee.  Skin appears thin with desquamination in area.  MS: moves all visible extremities without noticeable abnormality  PSYCH/NEURO: pleasant and cooperative, no obvious depression or anxiety, speech and thought processing grossly intact  ASSESSMENT AND PLAN:  Discussed the following assessment and plan:  Skin hypopigmentation  Thin skin  Hypopigmentation and thinning of  skin on left knee likely 2/2 steroid-induced hypopigmentation s/p steroid injection(s).  Advised symptoms more cosmetic.  Discussed resolution of symptoms with time.  Also consider discontinuation of steroid injection if cosmetic appearance is displeasing.  Continue follow-up with Ortho.  I discussed the assessment and treatment plan with the [name]. The [name] was provided an opportunity to ask questions and all were answered. The [name] agreed with the plan and demonstrated an understanding of the instructions.   The  [name] was advised to call back or seek an in-person evaluation if the symptoms worsen or if the condition fails to improve as anticipated.   Deeann Saint, MD

## 2023-04-04 NOTE — Progress Notes (Signed)
"  Patient was unable to self-report due to a lack of equipment at home via telehealth"

## 2023-05-25 ENCOUNTER — Ambulatory Visit (INDEPENDENT_AMBULATORY_CARE_PROVIDER_SITE_OTHER): Payer: BC Managed Care – PPO | Admitting: Family Medicine

## 2023-05-25 VITALS — BP 136/86 | HR 95 | Temp 98.9°F | Ht 67.0 in | Wt 262.0 lb

## 2023-05-25 DIAGNOSIS — Z1231 Encounter for screening mammogram for malignant neoplasm of breast: Secondary | ICD-10-CM

## 2023-05-25 DIAGNOSIS — I10 Essential (primary) hypertension: Secondary | ICD-10-CM | POA: Diagnosis not present

## 2023-05-25 DIAGNOSIS — Z0000 Encounter for general adult medical examination without abnormal findings: Secondary | ICD-10-CM

## 2023-05-25 DIAGNOSIS — L816 Other disorders of diminished melanin formation: Secondary | ICD-10-CM | POA: Diagnosis not present

## 2023-05-25 DIAGNOSIS — R7303 Prediabetes: Secondary | ICD-10-CM

## 2023-05-25 LAB — COMPREHENSIVE METABOLIC PANEL
ALT: 15 U/L (ref 0–35)
AST: 14 U/L (ref 0–37)
Albumin: 4.1 g/dL (ref 3.5–5.2)
Alkaline Phosphatase: 79 U/L (ref 39–117)
BUN: 11 mg/dL (ref 6–23)
CO2: 30 meq/L (ref 19–32)
Calcium: 9.3 mg/dL (ref 8.4–10.5)
Chloride: 103 meq/L (ref 96–112)
Creatinine, Ser: 0.84 mg/dL (ref 0.40–1.20)
GFR: 77.75 mL/min (ref >60.00)
Glucose, Bld: 87 mg/dL (ref 70–99)
Potassium: 4.2 meq/L (ref 3.5–5.1)
Sodium: 141 meq/L (ref 135–145)
Total Bilirubin: 0.5 mg/dL (ref 0.2–1.2)
Total Protein: 6.6 g/dL (ref 6.0–8.3)

## 2023-05-25 LAB — CBC WITH DIFFERENTIAL/PLATELET
Basophils Absolute: 0.1 10*3/uL (ref 0.0–0.1)
Basophils Relative: 1.7 % (ref 0.0–3.0)
Eosinophils Absolute: 0.2 10*3/uL (ref 0.0–0.7)
Eosinophils Relative: 2.1 % (ref 0.0–5.0)
HCT: 42.4 % (ref 36.0–46.0)
Hemoglobin: 14 g/dL (ref 12.0–15.0)
Lymphocytes Relative: 47 % — ABNORMAL HIGH (ref 12.0–46.0)
Lymphs Abs: 3.8 10*3/uL (ref 0.7–4.0)
MCHC: 32.9 g/dL (ref 30.0–36.0)
MCV: 93.4 fL (ref 78.0–100.0)
Monocytes Absolute: 0.5 10*3/uL (ref 0.1–1.0)
Monocytes Relative: 5.7 % (ref 3.0–12.0)
Neutro Abs: 3.5 10*3/uL (ref 1.4–7.7)
Neutrophils Relative %: 43.5 % (ref 43.0–77.0)
Platelets: 347 10*3/uL (ref 150.0–400.0)
RBC: 4.54 Mil/uL (ref 3.87–5.11)
RDW: 14.3 % (ref 11.5–15.5)
WBC: 8 10*3/uL (ref 4.0–10.5)

## 2023-05-25 LAB — TSH: TSH: 0.86 u[IU]/mL (ref 0.35–5.50)

## 2023-05-25 LAB — LIPID PANEL
Cholesterol: 171 mg/dL (ref 0–200)
HDL: 54.6 mg/dL (ref >39.00)
LDL Cholesterol: 99 mg/dL (ref 0–99)
NonHDL: 116.83
Total CHOL/HDL Ratio: 3
Triglycerides: 91 mg/dL (ref 0.0–149.0)
VLDL: 18.2 mg/dL (ref 0.0–40.0)

## 2023-05-25 LAB — HEMOGLOBIN A1C: Hgb A1c MFr Bld: 6 % (ref 4.6–6.5)

## 2023-05-25 LAB — T4, FREE: Free T4: 0.81 ng/dL (ref 0.60–1.60)

## 2023-05-25 MED ORDER — HYDROCHLOROTHIAZIDE 12.5 MG PO CAPS: 12.5000 mg | ORAL_CAPSULE | Freq: Every day | ORAL | 3 refills | Status: DC

## 2023-05-25 NOTE — Progress Notes (Signed)
Established Patient Office Visit   Subjective  Patient ID: Patricia [name], female    DOB: [birth year]  Age: 57 y.o. MRN: [id number]  Chief Complaint  Patient presents with   Annual Exam    Patient is a 57 year old female seen for CPE.  Patient states she has been doing well overall.  Requesting refills on HCTZ 12.5 mg for BP.  Patient had steroid knee injection in June.  Has since noticed skin of the left knee is lighter and thinner.  Also has area of discoloration below the kneecap and a lightening like linear distribution.  Has a follow-up with Ortho soon.  Pl[facility]  Had COVID and influenza vaccines 03/28/2023.  Seen by OB/GYN.  Now using estradiol 2 mg vaginally which is helping with dryness.    Patient Active Problem List   Diagnosis Date Noted   Primary osteoarthritis of both knees 03/22/2022   Prediabetes 04/06/2020   Pulmonary embolism without acute cor pulmonale (HCC) 02/09/2016   Hypertension 02/09/2016   Hypokalemia 02/09/2016   Obesity 04/01/2012   Tobacco abuse 04/01/2012   HEADACHE, TENSION 06/17/2010   ANKLE EDEMA 06/17/2010   Otitis media 03/18/2010   ACUTE BRONCHITIS 03/18/2010   VOMITING 03/18/2010   Acute upper respiratory infection 03/15/2009   ADVERSE DRUG REACTION 08/18/2008   PLANTAR FASCIITIS, RIGHT 08/14/2008   GASTROENTERITIS 03/20/2008   Past Medical History:  Diagnosis Date   Clotting disorder (HCC)    Hypertension    Sleep apnea    Past Surgical History:  Procedure Laterality Date   ABDOMINAL HYSTERECTOMY     BREAST REDUCTION SURGERY     KNEE SURGERY Left    REDUCTION MAMMAPLASTY Bilateral    TUBAL LIGATION     Social History   Tobacco Use   Smoking status: Every Day    Current packs/day: 0.00    Average packs/day: 0.5 packs/day for 10.0 years (5.0 ttl pk-yrs)    Types: Cigarettes    Last attempt to quit: 06/21/2020    Years since quitting: 2.9   Smokeless tobacco: Never   Tobacco  comments:    using electronic cigarettes too  Substance Use Topics   Alcohol use: Yes    Comment: occasional   Drug use: Yes   Family History  Problem Relation Age of Onset   Clotting disorder Mother    Hypertension Father    Diabetes Father    Clotting disorder Sister    Clotting disorder Brother    Clotting disorder Maternal Uncle    Pancreatic cancer Paternal Grandmother    Deep vein thrombosis Other    Colon cancer Neg Hx    Esophageal cancer Neg Hx    Rectal cancer Neg Hx    Stomach cancer Neg Hx    No Known Allergies    ROS Negative unless stated above    Objective:     BP 136/86 (BP Location: Right Arm, Patient Position: Sitting, Cuff Size: Large)   Pulse 95   Temp 98.9 F (37.2 C) (Oral)   Ht 5\' 7"  (1.702 m)   Wt 262 lb (118.8 kg)   SpO2 99%   BMI 41.04 kg/m  BP Readings from Last 3 Encounters:  05/25/23 136/86  09/21/22 120/88  09/14/22 138/88   Wt Readings from Last 3 Encounters:  05/25/23 262 lb (118.8 kg)  09/21/22 260 lb (117.9 kg)  09/14/22 260 lb (117.9 kg)      Physical Exam Constitutional:  Appearance: Normal appearance.  HENT:     Head: Normocephalic and atraumatic.     Right Ear: Tympanic membrane, ear canal and external ear normal.     Left Ear: Tympanic membrane, ear canal and external ear normal.     Nose: Nose normal.     Mouth/Throat:     Mouth: Mucous membranes are moist.     Pharynx: No oropharyngeal exudate or posterior oropharyngeal erythema.  Eyes:     General: No scleral icterus.    Extraocular Movements: Extraocular movements intact.     Conjunctiva/sclera: Conjunctivae normal.     Pupils: Pupils are equal, round, and reactive to light.  Neck:     Thyroid: No thyromegaly.  Cardiovascular:     Rate and Rhythm: Normal rate and regular rhythm.     Pulses: Normal pulses.     Heart sounds: Normal heart sounds. No murmur heard.    No friction rub.  Pulmonary:     Effort: Pulmonary effort is normal.     Breath  sounds: Normal breath sounds. No wheezing, rhonchi or rales.  Abdominal:     General: Bowel sounds are normal.     Palpations: Abdomen is soft.     Tenderness: There is no abdominal tenderness.  Musculoskeletal:        General: No deformity. Normal range of motion.  Lymphadenopathy:     Cervical: No cervical adenopathy.  Skin:    General: Skin is warm and dry.     Findings: No lesion.     Comments: Areas of left medial knee, midline L knee and superior left lower leg proximal to the knee.  Hyperpigmentation of the left lower leg and linear in distribution.  Thinning of skin at area of hypopigmentation of left knee midline with punctated area present.  Neurological:     General: No focal deficit present.     Mental Status: She is alert and oriented to person, place, and time.  Psychiatric:        Mood and Affect: Mood normal.        Thought Content: Thought content normal.      No results found for any visits on 05/25/23.    Assessment & Plan:  Well adult exam -Age-appropriate health screenings discussed -Obtain immunizations -[facility] -Pap up-to-date done 10/25/2021 with OB/GYN -Colonoscopy done 03/19/2018 -Immunizations reviewed -     Lipid panel; Future  Essential hypertension -Controlled -Continue HCTZ 12.5 mg daily -Continue lifestyle modifications -     CBC with Differential/Platelet; Future -     Comprehensive metabolic panel; Future -     TSH; Future -     hydroCHLOROthiazide; Take 1 capsule (12.5 mg total) by mouth daily.  Dispense: 90 capsule; Refill: 3 -     T4, free; Future  Prediabetes -Hemoglobin A1c 5.9% on 05/02/2021 -Lifestyle modifications -     Hemoglobin A1c; Future  Skin hypopigmentation -Likely steroid-induced s/p knee injection -Advised on likely duration of symptoms  Encounter for screening mammogram for malignant neoplasm of breast -     MM 3D DIAGNOSTIC MAMMOGRAM BILATERAL BREAST; Future   Next  CPE in 1 year. Return in about 6 months (around 11/23/2023) for chronic conditions.   Deeann Saint, MD

## 2023-05-25 NOTE — Progress Notes (Signed)
Proof of physical exam letter has been fill out for the patient and a copy was given to patient during visit

## 2023-08-07 ENCOUNTER — Other Ambulatory Visit: Payer: Self-pay | Admitting: Radiology

## 2023-08-07 DIAGNOSIS — N951 Menopausal and female climacteric states: Secondary | ICD-10-CM

## 2023-08-08 NOTE — Telephone Encounter (Signed)
 Needs OV.

## 2023-08-08 NOTE — Telephone Encounter (Signed)
 Med refill request: fluoxetine 10 mg  Last AEX: 10/21/21 Next AEX: none scheduled Last MMG (if hormonal med) 05/09/21 BI-RADS 1 negative Refill denied. Needs appointment.  Sent to provider for review.

## 2023-08-09 ENCOUNTER — Other Ambulatory Visit: Payer: Self-pay

## 2023-08-09 DIAGNOSIS — N951 Menopausal and female climacteric states: Secondary | ICD-10-CM

## 2023-08-09 MED ORDER — FLUOXETINE HCL 10 MG PO CAPS: 10.0000 mg | ORAL_CAPSULE | Freq: Every day | ORAL | 0 refills | Status: AC

## 2023-08-09 NOTE — Telephone Encounter (Signed)
 Med refill request: Prozac Last AEX: 10/21/21 Next AEX: 09/12/23 Last MMG (if hormonal med) 05/09/21 Refill authorized: Please Advise, #[phone], 0 RF

## 2023-09-12 ENCOUNTER — Ambulatory Visit: Payer: BC Managed Care – PPO | Admitting: Radiology

## 2023-10-03 ENCOUNTER — Ambulatory Visit (HOSPITAL_BASED_OUTPATIENT_CLINIC_OR_DEPARTMENT_OTHER): Admitting: Orthopaedic Surgery

## 2023-10-03 DIAGNOSIS — M25561 Pain in right knee: Secondary | ICD-10-CM

## 2023-10-03 DIAGNOSIS — M1712 Unilateral primary osteoarthritis, left knee: Secondary | ICD-10-CM

## 2023-10-03 NOTE — Progress Notes (Signed)
 Chief Complaint: Left knee pain     History of Present Illness:   10/03/2023: Presents today for left knee injection.  Patricia [name] is a 58 y.o. female presents today with ongoing left knee pain with known osteoarthritis.  She has been getting steroid injections most recently getting a Zilretta  injection in April 2024 by Dr. Dorothey Gate.  She is here today for further discussion.  She does enjoy being active and playing with her grandchildren although this has been quite limited as result of her knee pain.  She does endorse mechanical type popping and clicking in the knee.  This has been more bothersome for last 2 weeks.  She has been taking Aleve which helps.  She is experiencing swelling.  She is experiencing pain throughout the knee.    Surgical History:   none  PMH/PSH/Family History/Social History/Meds/Allergies:    Past Medical History:  Diagnosis Date   Clotting disorder (HCC)    Hypertension    Sleep apnea    Past Surgical History:  Procedure Laterality Date   ABDOMINAL HYSTERECTOMY     BREAST REDUCTION SURGERY     KNEE SURGERY Left    REDUCTION MAMMAPLASTY Bilateral    TUBAL LIGATION     Social History   Socioeconomic History   Marital status: Married    Spouse name: Not on file   Number of children: Not on file   Years of education: Not on file   Highest education level: Associate degree: academic program  Occupational History   Not on file  Tobacco Use   Smoking status: Every Day    Current packs/day: 0.00    Average packs/day: 0.5 packs/day for 10.0 years (5.0 ttl pk-yrs)    Types: Cigarettes    Last attempt to quit: 06/21/2020    Years since quitting: 3.2   Smokeless tobacco: Never   Tobacco comments:    using electronic cigarettes too  Substance and Sexual Activity   Alcohol use: Yes    Comment: occasional   Drug use: Yes   Sexual activity: Yes    Partners: Male    Birth control/protection: Surgical  Other  Topics Concern   Not on file  Social History Narrative   Not on file   Social Drivers of Health   Financial Resource Strain: Low Risk  (05/25/2023)   Overall Financial Resource Strain (CARDIA)    Difficulty of Paying Living Expenses: Not hard at all  Food Insecurity: No Food Insecurity (05/25/2023)   Hunger Vital Sign    Worried About Running Out of Food in the Last Year: Never true    Ran Out of Food in the Last Year: Never true  Transportation Needs: No Transportation Needs (05/25/2023)   PRAPARE - Administrator, Civil Service (Medical): No    Lack of Transportation (Non-Medical): No  Physical Activity: Unknown (05/25/2023)   Exercise Vital Sign    Days of Exercise per Week: 0 days    Minutes of Exercise per Session: Not on file  Stress: No Stress Concern Present (05/25/2023)   [employer] of Occupational Health - Occupational Stress Questionnaire    Feeling of Stress : Only a little  Social Connections: Socially Integrated (05/25/2023)   Social Connection and Isolation Panel [NHANES]    Frequency of Communication with Friends and Family: More  than three times a week    Frequency of Social Gatherings with Friends and Family: Once a week    Attends Religious Services: 1 to 4 times per year    Active Member of [employer] or Organizations: Yes    Attends Engineer, structural: More than 4 times per year    Marital Status: Married   Family History  Problem Relation Age of Onset   Clotting disorder Mother    Hypertension Father    Diabetes Father    Clotting disorder Sister    Clotting disorder Brother    Clotting disorder Maternal Uncle    Pancreatic cancer Paternal Grandmother    Deep vein thrombosis Other    Colon cancer Neg Hx    Esophageal cancer Neg Hx    Rectal cancer Neg Hx    Stomach cancer Neg Hx    No Known Allergies Current Outpatient Medications  Medication Sig Dispense Refill   Estradiol  10 MCG TABS vaginal tablet Place 1 tablet (10  mcg total) vaginally 2 (two) times a week. 24 tablet 3   FLUoxetine  (PROZAC ) 10 MG capsule Take 1 capsule (10 mg total) by mouth at bedtime. 90 capsule 0   fluticasone  (FLONASE ) 50 MCG/ACT nasal spray Place 1 spray into both nostrils daily. 16 g 0   hydrochlorothiazide  (MICROZIDE ) 12.5 MG capsule Take 1 capsule (12.5 mg total) by mouth daily. 90 capsule 3   No current facility-administered medications for this visit.   No results found.  Review of Systems:   A ROS was performed including pertinent positives and negatives as documented in the HPI.  Physical Exam :   Constitutional: NAD and appears stated age Neurological: Alert and oriented Psych: Appropriate affect and cooperative There were no vitals taken for this visit.   Comprehensive Musculoskeletal Exam:    Tenderness to palpation about tricompartmental joints with positive crepitus.  Range of motion is 0 to 125 degrees.  There is a trace effusion.  Distal neurosensory exam is intact  Right knee with medial joint line tenderness.  Range of motion is from 0 to 130 degrees.  Negative Lachman negative posterior drawer.  No effusion positive medial McMurray  Imaging:   Xray (4 views left knee): Moderate tricompartmental osteoarthritis    I personally reviewed and interpreted the radiographs.   Assessment:   59 y.o. female with moderate left knee pain in the setting of osteoarthritis.  Visit I did describe that she may ultimately be a candidate for partial lateral based knee replacement although I would like to obtain a new her MRI so that we can see if she is a candidate for this.  She would like to proceed with an injection.  I do also believe that she may be a candidate for treatment on the right knee as well if she is having pain on this side particularly medially as she continues to over weight-bear  on this side.  We will plan to proceed with this and I will plan to see her following back the results Plan :    -Left knee  ultrasound-guided injection performed after verbal consent obtained    Procedure Note  Patient: Patricia [name]             Date of Birth: 1965-07-13           MRN: [id number]             Visit Date: 10/03/2023  Procedures: Visit Diagnoses: No diagnosis found.  No procedures performed  I personally saw and evaluated the patient, and participated in the management and treatment plan.  Wilhelmenia Harada, MD Attending Physician, Orthopedic Surgery  This document was dictated using Dragon voice recognition software. A reasonable attempt at proof reading has been made to minimize errors.

## 2023-10-05 ENCOUNTER — Ambulatory Visit: Payer: Self-pay

## 2023-10-05 NOTE — Telephone Encounter (Signed)
  Chief Complaint: HA and high BP readings. Symptoms: HA Frequency: weds Pertinent Negatives: Patient denies one sided weakness Disposition: [] ED /[x] Urgent Care (no appt availability in office) / [] Appointment(In office/virtual)/ []  [facility]/ [] Home Care/ [] Refused Recommended Disposition /[] [facility]/ []  Follow-up with PCP Additional Notes: Pt started w/ HA on weds that has increased over time. Pt took her BP at [facility] yesterday. Readings were 197/96 HR 74, 189/88 HR 63 and 188/91 HR 64.  Pt states that she has not slept much d/t HA. She has taken her hydrochlorothiazide  and pain meds for HA. HA has not resolved. Pt does not have a home BP unit. Pt may purchase one today and keep a log for ov on Monday.  Pt will go to [facility] now for evaluation and care.   Reason for Disposition  [1] Systolic BP  >= 160 OR Diastolic >= 100 AND [2] cardiac (e.g., breathing difficulty, chest pain) or neurologic symptoms (e.g., new-onset blurred or double vision, unsteady gait)  Answer Assessment - Initial Assessment Questions 1. BLOOD PRESSURE: "What is the blood pressure?" "Did you take at least two measurements 5 minutes apart?"     yes 2. ONSET: "When did you take your blood pressure?"     Thursday 3. HOW: "How did you take your blood pressure?" (e.g., automatic home BP monitor, visiting nurse)     At [facility] 4. HISTORY: "Do you have a history of high blood pressure?"     yes 5. MEDICINES: "Are you taking any medicines for blood pressure?" "Have you missed any doses recently?"     HCTZ 6. OTHER SYMPTOMS: "Do you have any symptoms?" (e.g., blurred vision, chest pain, difficulty breathing, headache, weakness)     Head ache  Protocols used: Blood Pressure - High-A-AH

## 2023-10-05 NOTE — Telephone Encounter (Signed)
 Agree with evaluation today if having unresolved headache.  Also agree with patient obtaining home blood pressure cuff as store cuffs can often read higher.  As BP previously well-controlled patient should review recent diet and see if there is any changes such as increased sodium intake or decreased water intake.  Has follow-up appointment next week.

## 2023-10-05 NOTE — Telephone Encounter (Signed)
 Called and spoke with patient about elevated BP, patient is aware, patient has verbalized understanding, patient has obtained blood pressure cuff, advised patient to go to UC if blood pressure remains elevated.

## 2023-10-08 ENCOUNTER — Encounter: Payer: Self-pay | Admitting: Family Medicine

## 2023-10-08 ENCOUNTER — Ambulatory Visit (HOSPITAL_BASED_OUTPATIENT_CLINIC_OR_DEPARTMENT_OTHER): Admitting: Orthopaedic Surgery

## 2023-10-08 ENCOUNTER — Ambulatory Visit: Admitting: Family Medicine

## 2023-10-08 VITALS — BP 166/69 | HR 64 | Temp 98.3°F | Ht 67.0 in | Wt 271.4 lb

## 2023-10-08 DIAGNOSIS — R0683 Snoring: Secondary | ICD-10-CM | POA: Diagnosis not present

## 2023-10-08 DIAGNOSIS — F17210 Nicotine dependence, cigarettes, uncomplicated: Secondary | ICD-10-CM | POA: Diagnosis not present

## 2023-10-08 DIAGNOSIS — I10 Essential (primary) hypertension: Secondary | ICD-10-CM | POA: Diagnosis not present

## 2023-10-08 LAB — T4, FREE: Free T4: 0.96 ng/dL (ref 0.60–1.60)

## 2023-10-08 LAB — BASIC METABOLIC PANEL WITH GFR
BUN: 16 mg/dL (ref 6–23)
CO2: 31 meq/L (ref 19–32)
Calcium: 9 mg/dL (ref 8.4–10.5)
Chloride: 101 meq/L (ref 96–112)
Creatinine, Ser: 0.85 mg/dL (ref 0.40–1.20)
GFR: 76.45 mL/min (ref >60.00)
Glucose, Bld: 97 mg/dL (ref 70–99)
Potassium: 3.7 meq/L (ref 3.5–5.1)
Sodium: 139 meq/L (ref 135–145)

## 2023-10-08 LAB — TSH: TSH: 1.52 u[IU]/mL (ref 0.35–5.50)

## 2023-10-08 MED ORDER — POTASSIUM CHLORIDE CRYS ER 20 MEQ PO TBCR: 20.0000 meq | EXTENDED_RELEASE_TABLET | Freq: Every day | ORAL | 3 refills | Status: AC

## 2023-10-08 MED ORDER — HYDROCHLOROTHIAZIDE 25 MG PO TABS: 25.0000 mg | ORAL_TABLET | Freq: Every day | ORAL | 3 refills | Status: AC

## 2023-10-08 NOTE — Progress Notes (Addendum)
 Established Patient Office Visit   Subjective  Patient ID: Patricia [name], female    DOB: [birth year]  Age: 58 y.o. MRN: [id number]  Chief Complaint  Patient presents with   Hypertension    Pt is a 58 yo female seen for acute concern.  Pt states she developed a HA in occipital area 5 days ago and just felt off.  Took Goody's powder for headache without relief.  Also tried allergy medicine.  Headache resolved on Saturday but returned and is dull today.  Patient went to urgent care but was advised to get a BP cuff.  BP at [facility] was 197/96.  BP at home 159/101, 141/88, 115/66, 194/119, 161/89.  Patient denies CP, changes in vision, LE edema, changes in diet, sleep.  Denies increased stress though her father has Alzheimer's and is out of state.  Endorses regular amount of stress at work.  Smoking 1 pack every 2-3 days x 20 years.  Patient is ready to quit.  Has 2 more cigarettes in pack.  Tried patches and Nicorette  in the past however Nicorette  caused hiccups.    Patient Active Problem List   Diagnosis Date Noted   Snoring 10/08/2023   Cigarette nicotine  dependence without complication 10/08/2023   Primary osteoarthritis of both knees 03/22/2022   Prediabetes 04/06/2020   Pulmonary embolism without acute cor pulmonale (HCC) 02/09/2016   Essential hypertension 02/09/2016   Hypokalemia 02/09/2016   Obesity 04/01/2012   Tobacco abuse 04/01/2012   HEADACHE, TENSION 06/17/2010   ANKLE EDEMA 06/17/2010   Otitis media 03/18/2010   ACUTE BRONCHITIS 03/18/2010   VOMITING 03/18/2010   Acute upper respiratory infection 03/15/2009   ADVERSE DRUG REACTION 08/18/2008   PLANTAR FASCIITIS, RIGHT 08/14/2008   GASTROENTERITIS 03/20/2008   Past Medical History:  Diagnosis Date   Clotting disorder (HCC)    Hypertension    Sleep apnea    Past Surgical History:  Procedure Laterality Date   ABDOMINAL HYSTERECTOMY     BREAST REDUCTION SURGERY     KNEE SURGERY Left    REDUCTION MAMMAPLASTY  Bilateral    TUBAL LIGATION     Social History   Tobacco Use   Smoking status: Every Day    Current packs/day: 0.00    Average packs/day: 0.5 packs/day for 10.0 years (5.0 ttl pk-yrs)    Types: Cigarettes    Last attempt to quit: 06/21/2020    Years since quitting: 3.2   Smokeless tobacco: Never   Tobacco comments:    using electronic cigarettes too  Substance Use Topics   Alcohol use: Yes    Comment: occasional   Drug use: Yes   Family History  Problem Relation Age of Onset   Clotting disorder Mother    Hypertension Father    Diabetes Father    Clotting disorder Sister    Clotting disorder Brother    Clotting disorder Maternal Uncle    Pancreatic cancer Paternal Grandmother    Deep vein thrombosis Other    Colon cancer Neg Hx    Esophageal cancer Neg Hx    Rectal cancer Neg Hx    Stomach cancer Neg Hx    No Known Allergies    ROS Negative unless stated above    Objective:     BP (!) 166/69 (BP Location: Right Arm, Patient Position: Sitting, Cuff Size: Large)   Pulse 64   Temp 98.3 F (36.8 C) (Oral)   Ht 5\' 7"  (1.702 m)   Wt 271 lb 6.4 oz (123.1  kg)   SpO2 97%   BMI 42.51 kg/m  BP Readings from Last 3 Encounters:  10/08/23 (!) 166/69  05/25/23 136/86  09/21/22 120/88   Wt Readings from Last 3 Encounters:  10/08/23 271 lb 6.4 oz (123.1 kg)  05/25/23 262 lb (118.8 kg)  09/21/22 260 lb (117.9 kg)      Physical Exam Constitutional:      General: She is not in acute distress.    Appearance: Normal appearance.  HENT:     Head: Normocephalic and atraumatic.     Nose: Nose normal.     Mouth/Throat:     Mouth: Mucous membranes are moist.  Eyes:     Extraocular Movements:     Right eye: No nystagmus.     Left eye: No nystagmus.  Cardiovascular:     Rate and Rhythm: Normal rate and regular rhythm.     Heart sounds: Normal heart sounds. No murmur heard.    No gallop.  Pulmonary:     Effort: Pulmonary effort is normal. No respiratory distress.      Breath sounds: Normal breath sounds. No wheezing, rhonchi or rales.  Skin:    General: Skin is warm and dry.  Neurological:     Mental Status: She is alert and oriented to person, place, and time.      No results found for any visits on 10/08/23.    Assessment & Plan:  Essential hypertension -     Pulmonary Visit -     hydroCHLOROthiazide ; Take 1 tablet (25 mg total) by mouth daily.  Dispense: 90 tablet; Refill: 3 -     Potassium Chloride  Crys ER; Take 1 tablet (20 mEq total) by mouth daily.  Dispense: 90 tablet; Refill: 3 -     TSH; Future -     T4, free; Future -     Basic metabolic panel with GFR; Future  Snoring -     Pulmonary Visit  Cigarette nicotine  dependence without complication  BP uncontrolled.  BP rechecked 166/69, was 160/102.  Increase HCTZ from 12.5 to 25 mg daily.  Add K-Lor for potential hypokalemia.  Obtain labs.  Sleep study ordered.  Patient to check BP daily and keep a log of readings.  Notify clinic for continued elevations consistently greater than 140/90.  Patient motivated to quit smoking.  Currently smoking 1 pack every 2-3 days.  Nicorette  caused hiccups in the past.  Plans to quit cold [place].  Will notify clinic if wishes to start medication for smoking cessation assistance.  Given strict precautions.  Return in about 3 weeks (around 10/29/2023) for blood pressure.   Viola Greulich, MD

## 2023-10-10 ENCOUNTER — Encounter (HOSPITAL_BASED_OUTPATIENT_CLINIC_OR_DEPARTMENT_OTHER): Payer: Self-pay | Admitting: Orthopaedic Surgery

## 2023-10-13 ENCOUNTER — Ambulatory Visit
Admission: RE | Admit: 2023-10-13 | Discharge: 2023-10-13 | Disposition: A | Discharge status: Home / Self Care | Source: Ambulatory Visit | Attending: Orthopaedic Surgery | Admitting: Orthopaedic Surgery

## 2023-10-13 DIAGNOSIS — M1712 Unilateral primary osteoarthritis, left knee: Secondary | ICD-10-CM

## 2023-10-13 DIAGNOSIS — M25561 Pain in right knee: Secondary | ICD-10-CM

## 2023-10-29 ENCOUNTER — Ambulatory Visit: Admitting: Family Medicine

## 2023-10-31 ENCOUNTER — Ambulatory Visit (HOSPITAL_BASED_OUTPATIENT_CLINIC_OR_DEPARTMENT_OTHER): Admitting: Orthopaedic Surgery

## 2023-10-31 DIAGNOSIS — M1712 Unilateral primary osteoarthritis, left knee: Secondary | ICD-10-CM

## 2023-10-31 NOTE — Addendum Note (Signed)
 Addended by: Chales Pelissier L on: 10/31/2023 12:48 PM   Modules accepted: Orders

## 2023-10-31 NOTE — Progress Notes (Signed)
 Chief Complaint: Left knee pain     History of Present Illness:   10/31/2023: Presents today for follow-up of the left knee.  Unfortunately she only got predominantly 3 weeks of relief from this  Patricia [name] is a 58 y.o. female presents today with ongoing left knee pain with known osteoarthritis.  She has been getting steroid injections most recently getting a Zilretta  injection in April 2024 by Dr. Dorothey Gate.  She is here today for further discussion.  She does enjoy being active and playing with her grandchildren although this has been quite limited as result of her knee pain.  She does endorse mechanical type popping and clicking in the knee.  This has been more bothersome for last 2 weeks.  She has been taking Aleve which helps.  She is experiencing swelling.  She is experiencing pain throughout the knee.    Surgical History:   none  PMH/PSH/Family History/Social History/Meds/Allergies:    Past Medical History:  Diagnosis Date   Clotting disorder (HCC)    Hypertension    Sleep apnea    Past Surgical History:  Procedure Laterality Date   ABDOMINAL HYSTERECTOMY     BREAST REDUCTION SURGERY     KNEE SURGERY Left    REDUCTION MAMMAPLASTY Bilateral    TUBAL LIGATION     Social History   Socioeconomic History   Marital status: Married    Spouse name: Not on file   Number of children: Not on file   Years of education: Not on file   Highest education level: Associate degree: academic program  Occupational History   Not on file  Tobacco Use   Smoking status: Every Day    Current packs/day: 0.00    Average packs/day: 0.5 packs/day for 10.0 years (5.0 ttl pk-yrs)    Types: Cigarettes    Last attempt to quit: 06/21/2020    Years since quitting: 3.3   Smokeless tobacco: Never   Tobacco comments:    using electronic cigarettes too  Substance and Sexual Activity   Alcohol use: Yes    Comment: occasional   Drug use: Yes   Sexual  activity: Yes    Partners: Male    Birth control/protection: Surgical  Other Topics Concern   Not on file  Social History Narrative   Not on file   Social Drivers of Health   Financial Resource Strain: Low Risk  (05/25/2023)   Overall Financial Resource Strain (CARDIA)    Difficulty of Paying Living Expenses: Not hard at all  Food Insecurity: No Food Insecurity (05/25/2023)   Hunger Vital Sign    Worried About Running Out of Food in the Last Year: Never true    Ran Out of Food in the Last Year: Never true  Transportation Needs: No Transportation Needs (05/25/2023)   PRAPARE - Administrator, Civil Service (Medical): No    Lack of Transportation (Non-Medical): No  Physical Activity: Unknown (05/25/2023)   Exercise Vital Sign    Days of Exercise per Week: 0 days    Minutes of Exercise per Session: Not on file  Stress: No Stress Concern Present (05/25/2023)   [employer] of Occupational Health - Occupational Stress Questionnaire    Feeling of Stress : Only a little  Social Connections: Socially Integrated (05/25/2023)   Social Connection and  Isolation Panel [NHANES]    Frequency of Communication with Friends and Family: More than three times a week    Frequency of Social Gatherings with Friends and Family: Once a week    Attends Religious Services: 1 to 4 times per year    Active Member of [employer] or Organizations: Yes    Attends Engineer, structural: More than 4 times per year    Marital Status: Married   Family History  Problem Relation Age of Onset   Clotting disorder Mother    Hypertension Father    Diabetes Father    Clotting disorder Sister    Clotting disorder Brother    Clotting disorder Maternal Uncle    Pancreatic cancer Paternal Grandmother    Deep vein thrombosis Other    Colon cancer Neg Hx    Esophageal cancer Neg Hx    Rectal cancer Neg Hx    Stomach cancer Neg Hx    No Known Allergies Current Outpatient Medications  Medication  Sig Dispense Refill   Estradiol  10 MCG TABS vaginal tablet Place 1 tablet (10 mcg total) vaginally 2 (two) times a week. 24 tablet 3   FLUoxetine  (PROZAC ) 10 MG capsule Take 1 capsule (10 mg total) by mouth at bedtime. 90 capsule 0   fluticasone  (FLONASE ) 50 MCG/ACT nasal spray Place 1 spray into both nostrils daily. 16 g 0   hydrochlorothiazide  (HYDRODIURIL ) 25 MG tablet Take 1 tablet (25 mg total) by mouth daily. 90 tablet 3   potassium chloride  SA (KLOR-CON  M) 20 MEQ tablet Take 1 tablet (20 mEq total) by mouth daily. 90 tablet 3   No current facility-administered medications for this visit.   No results found.  Review of Systems:   A ROS was performed including pertinent positives and negatives as documented in the HPI.  Physical Exam :   Constitutional: NAD and appears stated age Neurological: Alert and oriented Psych: Appropriate affect and cooperative There were no vitals taken for this visit.   Comprehensive Musculoskeletal Exam:    Tenderness to palpation about tricompartmental joints with positive crepitus.  Range of motion is 0 to 125 degrees.  There is a trace effusion.  Distal neurosensory exam is intact  Right knee with medial joint line tenderness.  Range of motion is from 0 to 130 degrees.  Negative Lachman negative posterior drawer.  No effusion positive medial McMurray  Imaging:   Xray (4 views left knee): Moderate tricompartmental osteoarthritis    I personally reviewed and interpreted the radiographs.   Assessment:   58 y.o. female with moderate left knee pain in the setting of osteoarthritis.  Overall left knee is still feeling painful and she did not get significant relief from her injection.  Given that we will plan to proceed first with hyaluronic acid and no improvement from that we did discuss possibility of PRP Plan :    - Return to clinic for left knee hyaluronic acid injection following insurance authorization    I personally saw and evaluated  the patient, and participated in the management and treatment plan.  Wilhelmenia Harada, MD Attending Physician, Orthopedic Surgery  This document was dictated using Dragon voice recognition software. A reasonable attempt at proof reading has been made to minimize errors.

## 2023-11-01 ENCOUNTER — Telehealth (HOSPITAL_BASED_OUTPATIENT_CLINIC_OR_DEPARTMENT_OTHER): Payer: Self-pay | Admitting: Orthopaedic Surgery

## 2023-11-01 NOTE — Telephone Encounter (Signed)
Gel injection

## 2023-11-06 ENCOUNTER — Telehealth (HOSPITAL_BASED_OUTPATIENT_CLINIC_OR_DEPARTMENT_OTHER): Payer: Self-pay | Admitting: Orthopaedic Surgery

## 2023-11-06 NOTE — Telephone Encounter (Signed)
 Patient wants to know if her Gel injections was approved from her insurance

## 2023-11-06 NOTE — Telephone Encounter (Signed)
Tried calling patient concerning gel injection,but no answer and not able to leave a VM.

## 2023-11-06 NOTE — Telephone Encounter (Signed)
 VOB has been submitted for Orthovisc, left knee.

## 2023-11-14 ENCOUNTER — Telehealth: Payer: Self-pay

## 2023-11-14 NOTE — Telephone Encounter (Signed)
 PA pending approval through [facility] for Orthovisc, left knee.

## 2023-11-15 ENCOUNTER — Ambulatory Visit: Admitting: Radiology

## 2023-11-16 ENCOUNTER — Other Ambulatory Visit: Payer: Self-pay

## 2023-11-16 ENCOUNTER — Telehealth: Payer: Self-pay

## 2023-11-16 DIAGNOSIS — M1712 Unilateral primary osteoarthritis, left knee: Secondary | ICD-10-CM

## 2023-11-16 NOTE — Telephone Encounter (Signed)
 Please schedule patient for 3 appts.for gel injection.  Additonal information can be located under the referrals tab.

## 2023-11-16 NOTE — Telephone Encounter (Signed)
 Scheduled. Holding as a reminder to bring orthovisc to [facility]

## 2023-11-21 ENCOUNTER — Encounter (HOSPITAL_BASED_OUTPATIENT_CLINIC_OR_DEPARTMENT_OTHER): Payer: Self-pay | Admitting: Student

## 2023-11-21 ENCOUNTER — Ambulatory Visit (HOSPITAL_BASED_OUTPATIENT_CLINIC_OR_DEPARTMENT_OTHER): Admitting: Student

## 2023-11-21 DIAGNOSIS — M25562 Pain in left knee: Secondary | ICD-10-CM

## 2023-11-21 DIAGNOSIS — M1712 Unilateral primary osteoarthritis, left knee: Secondary | ICD-10-CM | POA: Diagnosis not present

## 2023-11-21 MED ORDER — HYALURONAN 30 MG/2ML IX SOSY
30.0000 mg | PREFILLED_SYRINGE | INTRA_ARTICULAR | Status: AC | PRN
  Administered 2023-11-21: 30 mg via INTRA_ARTICULAR

## 2023-11-21 MED ORDER — LIDOCAINE HCL 1 % IJ SOLN
4.0000 mL | INTRAMUSCULAR | Status: AC | PRN
  Administered 2023-11-21: 4 mL

## 2023-11-21 NOTE — Progress Notes (Signed)
     HPI: Patient presents today for the first Orthovisc injection of the left knee.  States that she continues to have significant left knee pain which is affecting her daily activities.  She has been using Biofreeze and Voltaren  without much relief.  Driving or riding in a car for long periods of time significantly exacerbates her pain.  She is planning on participating in a fundraiser walk in September.   Physical Exam: Left knee exam demonstrates tenderness palpation over the medial joint line.  No significant effusion present today without overlying erythema or warmth.  Active range of motion is from 0 to 120 degrees with palpable crepitus.   Procedure Note  Patient: Patricia [name]             Date of Birth: 28-Apr-1966           MRN: [id number]             Visit Date: 11/21/2023  Procedures: Visit Diagnoses:  1. Unilateral primary osteoarthritis, left knee     Large Joint Inj: L knee on 11/21/2023 8:50 AM Indications: pain Details: 22 G 1.5 in needle, anterolateral approach Medications: 4 mL lidocaine  1 %; 30 mg Hyaluronan 30 MG/2ML Outcome: tolerated well, no immediate complications Procedure, treatment alternatives, risks and benefits explained, specific risks discussed. Consent was given by the patient. Immediately prior to procedure a time out was called to verify the correct patient, procedure, equipment, support staff and site/side marked as required. Patient was prepped and draped in the usual sterile fashion.       Plan: Return to clinic in 1 week for Orthovisc injection #2    I personally saw and evaluated the patient, and participated in the management and treatment plan.  Sharrell Deck, PA-C Orthopedics

## 2023-11-27 ENCOUNTER — Encounter: Payer: Self-pay | Admitting: Family Medicine

## 2023-11-28 ENCOUNTER — Encounter (HOSPITAL_BASED_OUTPATIENT_CLINIC_OR_DEPARTMENT_OTHER): Payer: Self-pay | Admitting: Student

## 2023-11-28 ENCOUNTER — Ambulatory Visit (HOSPITAL_BASED_OUTPATIENT_CLINIC_OR_DEPARTMENT_OTHER): Admitting: Student

## 2023-11-28 DIAGNOSIS — M1712 Unilateral primary osteoarthritis, left knee: Secondary | ICD-10-CM | POA: Diagnosis not present

## 2023-11-28 MED ORDER — HYALURONAN 30 MG/2ML IX SOSY
30.0000 mg | PREFILLED_SYRINGE | INTRA_ARTICULAR | Status: AC | PRN
Start: 2023-11-28 — End: 2023-11-28
  Administered 2023-11-28: 30 mg via INTRA_ARTICULAR

## 2023-11-28 MED ORDER — LIDOCAINE HCL 1 % IJ SOLN
4.0000 mL | INTRAMUSCULAR | Status: AC | PRN
  Administered 2023-11-28: 4 mL

## 2023-11-28 NOTE — Progress Notes (Signed)
     HPI: Patient presents today for Orthovisc injection #2.  Overall she reports little changes in her knee pain since injection last week.  Continues to experience mild to moderate pain as well as stiffness.  Denies any fever or chills.   Physical Exam: Ortho exam of the left knee demonstrates medial joint line tenderness.  Active range of motion from 0 to 120 degrees with crepitus.  Stable collaterals with varus and valgus stress.  No overlying erythema or warmth.  Minimal effusion.   Procedure Note  Patient: Patricia [name]             Date of Birth: 03-28-1966           MRN: [id number]             Visit Date: 11/28/2023  Procedures: Visit Diagnoses:  1. Unilateral primary osteoarthritis, left knee     Large Joint Inj: L knee on 11/28/2023 8:48 AM Indications: pain Details: 22 G 1.5 in needle, anterolateral approach Medications: 4 mL lidocaine  1 %; 30 mg Hyaluronan 30 MG/2ML Outcome: tolerated well, no immediate complications Procedure, treatment alternatives, risks and benefits explained, specific risks discussed. Consent was given by the patient. Immediately prior to procedure a time out was called to verify the correct patient, procedure, equipment, support staff and site/side marked as required. Patient was prepped and draped in the usual sterile fashion.       Plan: Return to clinic next week for left knee Orthovisc injection #3    I personally saw and evaluated the patient, and participated in the management and treatment plan.  Sharrell Deck, PA-C Orthopedics

## 2023-12-05 ENCOUNTER — Ambulatory Visit (HOSPITAL_BASED_OUTPATIENT_CLINIC_OR_DEPARTMENT_OTHER): Admitting: Student

## 2023-12-05 ENCOUNTER — Encounter (HOSPITAL_BASED_OUTPATIENT_CLINIC_OR_DEPARTMENT_OTHER): Payer: Self-pay | Admitting: Student

## 2023-12-05 DIAGNOSIS — M1712 Unilateral primary osteoarthritis, left knee: Secondary | ICD-10-CM | POA: Diagnosis not present

## 2023-12-05 MED ORDER — HYALURONAN 30 MG/2ML IX SOSY
30.0000 mg | PREFILLED_SYRINGE | INTRA_ARTICULAR | Status: AC | PRN
  Administered 2023-12-05: 30 mg via INTRA_ARTICULAR

## 2023-12-05 MED ORDER — LIDOCAINE HCL 1 % IJ SOLN
4.0000 mL | INTRAMUSCULAR | Status: AC | PRN
  Administered 2023-12-05: 4 mL

## 2023-12-05 NOTE — Progress Notes (Signed)
     HPI: Patient presents to clinic today for Orthovisc injection #3.  States that she did have a little bit of discomfort in the knee during the middle of last week, however this is resolved and overall her knee is feeling well.  Denies any significant changes.   Physical Exam: Left knee active range of motion is from 0 to 120 degrees.  Positive for medial joint line tenderness.  No overlying erythema or warmth.   Procedure Note  Patient: Patricia [name]             Date of Birth: 1966-02-05           MRN: [id number]             Visit Date: 12/05/2023  Procedures: Visit Diagnoses:  1. Unilateral primary osteoarthritis, left knee     Large Joint Inj: L knee on 12/05/2023 8:37 AM Indications: pain Details: 22 G 1.5 in needle, anterolateral approach Medications: 4 mL lidocaine  1 %; 30 mg Hyaluronan 30 MG/2ML Outcome: tolerated well, no immediate complications Procedure, treatment alternatives, risks and benefits explained, specific risks discussed. Consent was given by the patient. Immediately prior to procedure a time out was called to verify the correct patient, procedure, equipment, support staff and site/side marked as required. Patient was prepped and draped in the usual sterile fashion.       Plan: Injection performed today, assess relief and return to clinic as needed    I personally saw and evaluated the patient, and participated in the management and treatment plan.  Leonce Reveal, PA-C Orthopedics

## 2023-12-24 NOTE — Progress Notes (Unsigned)
 @Patient  ID: Patricia [name], female    DOB: 06/17/1965, 58 y.o.   MRN: [id number]  No chief complaint on file.   Referring provider: Mercer Clotilda SAUNDERS, MD  HPI: 58 year old female, current every day smoker. PMH HTN, pulmonary embolism, pre-diabetes, obesity.   12/25/2023 Patricia [name] is a 58 year old female with sleep apnea who presents for a sleep consult.  She was diagnosed with sleep apnea approximately four years ago following a sleep study and was prescribed a CPAP machine, which she initially tolerated well. However, the machine was recalled, and she has not been using it since.  She continues to experience symptoms of sleep apnea, including daytime exhaustion, frequent awakenings at night, and snoring. Her husband has observed episodes where she stops breathing during sleep, prompting him to nudge her awake. She also experiences waking up gasping or choking and talking in her sleep.  Her sleep routine involves going to bed around 9:30 to 10:00 PM, falling asleep within 30 to 45 minutes, and waking up about four times a night. She typically starts her day at 6:00 AM. She experiences significant daytime sleepiness, especially in quiet environments, and needs to move around to stay awake.  Her weight has fluctuated by about 10 pounds over the past few months. No history of seizures. She reports a past pulmonary embolism in 2017, which was not provoked by surgery or other known factors. She is not currently on blood thinners.   No Known Allergies  Immunization History  Administered Date(s) Administered   Influenza Split 03/13/2011, 04/01/2012   Influenza Whole 03/26/2010   Influenza,inj,Quad PF,6+ Mos 04/08/2013, 04/02/2017, 03/15/2018, 03/08/2019, 04/05/2020, 02/17/2021, 02/28/2022   Influenza-Unspecified 03/28/2023   Janssen (J&J) SARS-COV-2 Vaccination 08/24/2019   PFIZER(Purple Top)SARS-COV-2 Vaccination 12/22/2020   Td 08/14/2008   Tdap 02/28/2022   Unspecified  SARS-COV-2 Vaccination 03/28/2023   Zoster Recombinant(Shingrix ) 02/28/2022, 05/22/2022    Past Medical History:  Diagnosis Date   Clotting disorder (HCC)    Hypertension    Sleep apnea     Tobacco History: Social History   Tobacco Use  Smoking Status Every Day   Current packs/day: 0.00   Average packs/day: 0.5 packs/day for 10.0 years (5.0 ttl pk-yrs)   Types: Cigarettes   Last attempt to quit: 06/21/2020   Years since quitting: 3.5  Smokeless Tobacco Never  Tobacco Comments   using electronic cigarettes too   Ready to quit: Not Answered Counseling given: Not Answered Tobacco comments: using electronic cigarettes too   Outpatient Medications Prior to Visit  Medication Sig Dispense Refill   Estradiol  10 MCG TABS vaginal tablet Place 1 tablet (10 mcg total) vaginally 2 (two) times a week. 24 tablet 3   FLUoxetine  (PROZAC ) 10 MG capsule Take 1 capsule (10 mg total) by mouth at bedtime. 90 capsule 0   fluticasone  (FLONASE ) 50 MCG/ACT nasal spray Place 1 spray into both nostrils daily. 16 g 0   hydrochlorothiazide  (HYDRODIURIL ) 25 MG tablet Take 1 tablet (25 mg total) by mouth daily. 90 tablet 3   potassium chloride  SA (KLOR-CON  M) 20 MEQ tablet Take 1 tablet (20 mEq total) by mouth daily. 90 tablet 3   No facility-administered medications prior to visit.   Review of Systems  Review of Systems  Constitutional: Negative.   HENT: Negative.    Respiratory: Negative.    Cardiovascular: Negative.    Physical Exam  There were no vitals taken for this visit. Physical Exam Constitutional:      General:  She is not in acute distress.    Appearance: Normal appearance. She is obese. She is not ill-appearing.  HENT:     Head: Normocephalic and atraumatic.     Mouth/Throat:     Mouth: Mucous membranes are moist.     Pharynx: Oropharynx is clear.  Cardiovascular:     Rate and Rhythm: Normal rate and regular rhythm.  Pulmonary:     Effort: Pulmonary effort is normal.      Breath sounds: Normal breath sounds.  Musculoskeletal:        General: Normal range of motion.  Skin:    General: Skin is warm and dry.  Neurological:     General: No focal deficit present.     Mental Status: She is alert and oriented to person, place, and time. Mental status is at baseline.  Psychiatric:        Mood and Affect: Mood normal.        Behavior: Behavior normal.        Thought Content: Thought content normal.        Judgment: Judgment normal.      Lab Results:  CBC    Component Value Date/Time   WBC 8.0 05/25/2023 1217   RBC 4.54 05/25/2023 1217   HGB 14.0 05/25/2023 1217   HCT 42.4 05/25/2023 1217   PLT 347.0 05/25/2023 1217   MCV 93.4 05/25/2023 1217   MCH 30.1 04/05/2020 0848   MCHC 32.9 05/25/2023 1217   RDW 14.3 05/25/2023 1217   LYMPHSABS 3.8 05/25/2023 1217   MONOABS 0.5 05/25/2023 1217   EOSABS 0.2 05/25/2023 1217   BASOSABS 0.1 05/25/2023 1217    BMET    Component Value Date/Time   NA 139 10/08/2023 0854   K 3.7 10/08/2023 0854   CL 101 10/08/2023 0854   CO2 31 10/08/2023 0854   GLUCOSE 97 10/08/2023 0854   BUN 16 10/08/2023 0854   CREATININE 0.85 10/08/2023 0854   CREATININE 0.83 04/05/2020 0848   CALCIUM 9.0 10/08/2023 0854   GFRNONAA 81 04/05/2020 0848   GFRAA 93 04/05/2020 0848    BNP    Component Value Date/Time   BNP 66.9 02/09/2016 1628    ProBNP    Component Value Date/Time   PROBNP 21.0 12/12/2019 1208    Imaging: No results found.   Assessment & Plan:   1. Snoring (Primary) - Home sleep test; Future  2. Hx of sleep apnea - Home sleep test; Future   Assessment & Plan Obstructive Sleep Apnea Obstructive sleep apnea diagnosed four years ago, previously on CPAP but machine was possibly recalled/malfunctioning. Currently experiencing daytime sleepiness, nocturnal awakenings, snoring, and episodes of gasping or choking during sleep. Epworth score of 9 indicates mild to moderate daytime sleepiness. Husband reports  apnea episodes. Sleep study needed to reestablish diagnosis prior to providing patient with new CPAP machine. Risks of untreated sleep apnea include cardiac arrhythmia, stroke, pulmonary hypertension, diabetes, and potential links to Alzheimer's disease. - Order home sleep study through [facility]. - Initiate auto CPAP therapy based on sleep study results. - Advise side sleeping or using a wedge pillow to reduce apneic events.  Obesity Obesity with BMI over 30. Weight fluctuates by 10 pounds, currently up by 3-4 pounds. Discussed potential use of Zepbound for weight loss, approved for obesity and sleep apnea. Insurance coverage for Zepbound is uncertain, self-pay option available. No history of thyroid  cancer or diabetes. Discussed potential side effects of Zepbound, including nausea, vomiting, diarrhea, and constipation, and management  strategies. - Check insurance coverage for Zepbound. - Consider prescribing Zepbound if covered or if she opts for self-pay. - Advise on potential side effects of Zepbound and management strategies, including hydration and use of Zofran  for nausea and Imodium for diarrhea. - Discuss potential for weight loss to reduce need for CPAP therapy.  Pulmonary Embolism Pulmonary embolism in 2017, unprovoked by surgery or other known factors. No current anticoagulation therapy.   Almarie LELON Ferrari, NP 12/24/2023

## 2023-12-25 ENCOUNTER — Encounter: Payer: Self-pay | Admitting: Primary Care

## 2023-12-25 ENCOUNTER — Ambulatory Visit (INDEPENDENT_AMBULATORY_CARE_PROVIDER_SITE_OTHER): Admitting: Primary Care

## 2023-12-25 VITALS — BP 140/72 | HR 67 | Temp 97.3°F | Ht 67.0 in | Wt 273.6 lb

## 2023-12-25 DIAGNOSIS — E669 Obesity, unspecified: Secondary | ICD-10-CM | POA: Diagnosis not present

## 2023-12-25 DIAGNOSIS — I2699 Other pulmonary embolism without acute cor pulmonale: Secondary | ICD-10-CM

## 2023-12-25 DIAGNOSIS — F17210 Nicotine dependence, cigarettes, uncomplicated: Secondary | ICD-10-CM

## 2023-12-25 DIAGNOSIS — G4733 Obstructive sleep apnea (adult) (pediatric): Secondary | ICD-10-CM

## 2023-12-25 DIAGNOSIS — Z6841 Body Mass Index (BMI) 40.0 and over, adult: Secondary | ICD-10-CM

## 2023-12-25 DIAGNOSIS — R0683 Snoring: Secondary | ICD-10-CM

## 2023-12-25 DIAGNOSIS — Z8669 Personal history of other diseases of the nervous system and sense organs: Secondary | ICD-10-CM

## 2023-12-25 NOTE — Patient Instructions (Addendum)
     Today, you came in for a sleep consultation due to ongoing symptoms of sleep apnea. You were previously diagnosed with sleep apnea and used a CPAP machine, but have not been using it since it was recalled. You are experiencing daytime exhaustion, frequent awakenings at night, and snoring. Your husband has noticed episodes where you stop breathing during sleep. We discussed your sleep routine, weight fluctuations, and past medical history, including a pulmonary embolism in 2017.  YOUR PLAN:  -OBSTRUCTIVE SLEEP APNEA: Obstructive sleep apnea is a condition where your airway becomes blocked during sleep, causing breathing to stop and start repeatedly. We will order a home sleep study to reestablish your diagnosis and determine the appropriate CPAP settings. Based on the results, we will initiate auto CPAP therapy. In the meantime, try sleeping on your side or using a wedge pillow to reduce the number of apneic events.  -OBESITY: Obesity is a condition where you have an excessive amount of body fat, which can affect your health. Your weight has fluctuated recently, and we discussed the potential use of Zepbound for weight loss. We will check your insurance coverage for Zepbound, and if it is covered or if you choose to self-pay, we may prescribe it. Be aware of potential side effects like nausea, vomiting, diarrhea, and constipation, and manage them with hydration and medications like Zofran  for nausea and Imodium for diarrhea. Losing weight may also help reduce your need for CPAP therapy.  -PULMONARY EMBOLISM: A pulmonary embolism is a blockage in one of the pulmonary arteries in your lungs, which you experienced in 2017. There are no changes to your current management plan for this condition.  -HYPERTENSION: Hypertension is high blood pressure. There were no specific discussions or changes to your management plan for this condition during this visit.  INSTRUCTIONS: Please complete the home sleep  study as ordered. Follow up with us  to review the results and discuss the next steps for your CPAP therapy. Check with your insurance about coverage for Zepbound and let us  know your decision regarding its use. Continue monitoring your blood pressure and follow your current management plan for hypertension. If you have any questions or concerns, please contact our office.  Follow-up 10-12 weeks with Landry NP / call after sleep study for results and prescription for CPAP

## 2024-01-03 ENCOUNTER — Encounter

## 2024-01-03 DIAGNOSIS — Z8669 Personal history of other diseases of the nervous system and sense organs: Secondary | ICD-10-CM

## 2024-01-03 DIAGNOSIS — R0683 Snoring: Secondary | ICD-10-CM

## 2024-01-08 ENCOUNTER — Encounter: Payer: Self-pay | Admitting: Podiatry

## 2024-01-08 ENCOUNTER — Ambulatory Visit: Admitting: Podiatry

## 2024-01-08 DIAGNOSIS — M722 Plantar fascial fibromatosis: Secondary | ICD-10-CM

## 2024-01-08 MED ORDER — TRIAMCINOLONE ACETONIDE 40 MG/ML IJ SUSP
20.0000 mg | Freq: Once | INTRAMUSCULAR | Status: AC
  Administered 2024-01-08: 20 mg

## 2024-01-08 NOTE — Progress Notes (Signed)
 She presents today for follow-up of her plantar fibroma states that is considerably sore and seems to be getting bigger.  Objective: Vital signs are stable alert and oriented x 3.  Palpable 1 cm lesion medial distal band of the plantar fascia left.  Assessment: Plantar fibromatosis left foot.  Plan: Discussed etiology pathology conservative therapies at this point I injected 40 mg of Kenalog  into the plantar fibroma with local anesthetic she tolerated procedure well patient will follow-up with us  on an as-needed basis.  We did discuss topical compounding agent which she states that she might like to do next time if it comes back.

## 2024-01-11 ENCOUNTER — Telehealth: Payer: Self-pay | Admitting: Pulmonary Disease

## 2024-01-11 DIAGNOSIS — G4733 Obstructive sleep apnea (adult) (pediatric): Secondary | ICD-10-CM

## 2024-01-11 NOTE — Telephone Encounter (Signed)
 ATC X1. LMTCB

## 2024-01-11 NOTE — Telephone Encounter (Signed)
 Call patient  Sleep study result  Date of study: 01/03/2024  Impression: Moderate obstructive sleep apnea with moderate oxygen desaturations AHI of 25.8 with O2 nadir of 83%  Recommendation: DME referral  Recommend CPAP therapy for moderate obstructive sleep apnea  Auto titrating CPAP with pressure settings of 5-15 will be appropriate, with heated humidification with patient's mask of choice.  Encourage weight loss measures  Follow-up in the office 4 to 6 weeks following initiation of treatment

## 2024-01-11 NOTE — Telephone Encounter (Signed)
 Pt called me back. Pt was informed of Dr Cathye note and verbalized understanding. I am placing CPAP order through [facility]. NFN

## 2024-01-23 NOTE — Telephone Encounter (Signed)
 Copied from CRM #[id number]. Topic: Clinical - Order For Equipment >> Jan 23, 2024 12:17 PM Joesph PARAS wrote: Reason for CRM: Patient is calling in to inquire about DME supplies, as order was placed but she has not heard anything from them. Patient requesting an update if possible.  Called and spoke with the pt, advised we did place order 8/1 and it was sent to [facility]. I provided pt with [facility] contact number [phone]. nfn

## 2024-01-25 ENCOUNTER — Ambulatory Visit: Admitting: Family Medicine

## 2024-01-25 VITALS — BP 138/82 | HR 65 | Temp 98.1°F | Ht 67.0 in | Wt 267.0 lb

## 2024-01-25 DIAGNOSIS — R2232 Localized swelling, mass and lump, left upper limb: Secondary | ICD-10-CM | POA: Diagnosis not present

## 2024-01-25 DIAGNOSIS — M19042 Primary osteoarthritis, left hand: Secondary | ICD-10-CM | POA: Diagnosis not present

## 2024-01-25 DIAGNOSIS — M79642 Pain in left hand: Secondary | ICD-10-CM

## 2024-01-25 DIAGNOSIS — M19032 Primary osteoarthritis, left wrist: Secondary | ICD-10-CM

## 2024-01-25 MED ORDER — DOXYCYCLINE HYCLATE 100 MG PO TABS: 100.0000 mg | ORAL_TABLET | Freq: Two times a day (BID) | ORAL | 0 refills | Status: AC

## 2024-01-25 MED ORDER — PREDNISONE 10 MG PO TABS: ORAL_TABLET | ORAL | 0 refills | Status: AC

## 2024-01-25 NOTE — Progress Notes (Signed)
 Acute Office Visit   Subjective:  Patient ID: Patricia [name], female    DOB: 31-Oct-1965, 58 y.o.   MRN: [id number]  Chief Complaint  Patient presents with   hand swelling    HPI Patient is present for an acute visit since her PCP, Dr. Mercer, is not available. She reports yesterday when lying her left lateral side of hand down on her desk she noticed it was sore. Since then the pain has increased with warmth, with swelling, and numbness/tingling. Pain is described as stinging, constant pain. Still has full ROM of fingers and wrist. No pain with movement of joints. Denies any injury or any insect bites.   ROS See HPI above      Objective:   BP 138/82   Pulse 65   Temp 98.1 F (36.7 C) (Oral)   Ht 5' 7 (1.702 m)   Wt 267 lb (121.1 kg)   SpO2 98%   BMI 41.82 kg/m    Physical Exam Vitals reviewed.  Constitutional:      General: She is not in acute distress.    Appearance: Normal appearance. She is not ill-appearing, toxic-appearing or diaphoretic.  Eyes:     General:        Right eye: No discharge.        Left eye: No discharge.     Conjunctiva/sclera: Conjunctivae normal.  Cardiovascular:     Rate and Rhythm: Normal rate.  Pulmonary:     Effort: Pulmonary effort is normal. No respiratory distress.     Breath sounds: Normal breath sounds.  Musculoskeletal:        General: Normal range of motion.  Skin:    General: Skin is warm and dry.     Comments: Pictures of left hand swelling, inflamed, and warm to touch.   Neurological:     General: No focal deficit present.     Mental Status: She is alert and oriented to person, place, and time. Mental status is at baseline.  Psychiatric:        Mood and Affect: Mood normal.        Behavior: Behavior normal.        Thought Content: Thought content normal.        Judgment: Judgment normal.           Assessment & Plan:  Left hand pain -     Doxycycline  Hyclate; Take 1 tablet (100 mg total) by mouth 2 (two) times  daily for 7 days.  Dispense: 14 tablet; Refill: 0 -     predniSONE ; Take 6 tablets (60 mg total) by mouth daily with breakfast for 1 day, THEN 5 tablets (50 mg total) daily with breakfast for 1 day, THEN 4 tablets (40 mg total) daily with breakfast for 1 day, THEN 3 tablets (30 mg total) daily with breakfast for 1 day, THEN 2 tablets (20 mg total) daily with breakfast for 1 day, THEN 1 tablet (10 mg total) daily with breakfast for 1 day.  Dispense: 21 tablet; Refill: 0  Localized swelling on left hand -     Doxycycline  Hyclate; Take 1 tablet (100 mg total) by mouth 2 (two) times daily for 7 days.  Dispense: 14 tablet; Refill: 0 -     predniSONE ; Take 6 tablets (60 mg total) by mouth daily with breakfast for 1 day, THEN 5 tablets (50 mg total) daily with breakfast for 1 day, THEN 4 tablets (40 mg total) daily with breakfast for 1 day, THEN  3 tablets (30 mg total) daily with breakfast for 1 day, THEN 2 tablets (20 mg total) daily with breakfast for 1 day, THEN 1 tablet (10 mg total) daily with breakfast for 1 day.  Dispense: 21 tablet; Refill: 0  Joint inflammation of left hand and wrist -     Doxycycline  Hyclate; Take 1 tablet (100 mg total) by mouth 2 (two) times daily for 7 days.  Dispense: 14 tablet; Refill: 0 -     predniSONE ; Take 6 tablets (60 mg total) by mouth daily with breakfast for 1 day, THEN 5 tablets (50 mg total) daily with breakfast for 1 day, THEN 4 tablets (40 mg total) daily with breakfast for 1 day, THEN 3 tablets (30 mg total) daily with breakfast for 1 day, THEN 2 tablets (20 mg total) daily with breakfast for 1 day, THEN 1 tablet (10 mg total) daily with breakfast for 1 day.  Dispense: 21 tablet; Refill: 0  -Prescribed Doxycycline  100mg  tablet, take 1 tablet every 12 hours for 7 days.  -Prescribed Prednisone  10mg , 6 day taper. Please do not take NSAIDS, such as Ibuprofen, Advil, Aleve, or Naproxen while taking steroid. -May use cool compresses to the area, 4-6 times a day for 20  minutes at a time.  -If symptoms become worse over the weekend, please go directly to the emergency department. If symptoms do not improve, follow up.  Jullia Mulligan, NP

## 2024-01-25 NOTE — Patient Instructions (Addendum)
-  It was a pleasure to care for you today.  -Prescribed Doxycycline  100mg  tablet, take 1 tablet every 12 hours for 7 days.  -Prescribed Prednisone  10mg , 6 day taper. Please do not take NSAIDS, such as Ibuprofen, Advil, Aleve, or Naproxen while taking steroid. -May use cool compresses to the area, 4-6 times a day for 20 minutes at a time.  -If symptoms become worse over the weekend, please go directly to the emergency department. If symptoms do not improve, follow up.

## 2024-01-29 ENCOUNTER — Ambulatory Visit: Payer: Self-pay | Admitting: Primary Care

## 2024-01-29 NOTE — Progress Notes (Signed)
 Please let patient know HST showed moderate sleep apnea, recommend she be started on CPAP auto 5-15cm h20 and fu in 31-90 days

## 2024-02-04 NOTE — Progress Notes (Signed)
 Called the pt and there was no answer, and her VM was full. Will send msg via mychart.

## 2024-04-02 ENCOUNTER — Ambulatory Visit (HOSPITAL_BASED_OUTPATIENT_CLINIC_OR_DEPARTMENT_OTHER): Admitting: Student

## 2024-04-02 ENCOUNTER — Ambulatory Visit (HOSPITAL_BASED_OUTPATIENT_CLINIC_OR_DEPARTMENT_OTHER): Admitting: Orthopaedic Surgery

## 2024-04-02 DIAGNOSIS — M1712 Unilateral primary osteoarthritis, left knee: Secondary | ICD-10-CM | POA: Diagnosis not present

## 2024-04-02 DIAGNOSIS — M25562 Pain in left knee: Secondary | ICD-10-CM | POA: Diagnosis not present

## 2024-04-02 MED ORDER — TRIAMCINOLONE ACETONIDE 40 MG/ML IJ SUSP
2.0000 mL | INTRAMUSCULAR | Status: AC | PRN
  Administered 2024-04-02: 2 mL via INTRA_ARTICULAR

## 2024-04-02 MED ORDER — LIDOCAINE HCL 1 % IJ SOLN
4.0000 mL | INTRAMUSCULAR | Status: AC | PRN
  Administered 2024-04-02: 4 mL

## 2024-04-02 NOTE — Progress Notes (Signed)
 Chief Complaint: Left knee pain     History of Present Illness:    Patricia [name] is a 58 y.o. female who presents today for follow-up of her left knee.  She does have known osteoarthritis and was last seen in clinic in June 2025 for Orthovisc gel injections.  Unfortunately she states that she did not get any relief from these.  Pain is consistent throughout the day and is typically worse in the morning.  She has tried Voltaren  and is taking Aleve consistently.  She has also undergone cortisone and Zilretta  injections in the past.   Surgical History:   Prior left knee surgery  PMH/PSH/Family History/Social History/Meds/Allergies:    Past Medical History:  Diagnosis Date   Clotting disorder    Hypertension    Sleep apnea    Past Surgical History:  Procedure Laterality Date   ABDOMINAL HYSTERECTOMY     BREAST REDUCTION SURGERY     KNEE SURGERY Left    REDUCTION MAMMAPLASTY Bilateral    TUBAL LIGATION     Social History   Socioeconomic History   Marital status: Married    Spouse name: Not on file   Number of children: Not on file   Years of education: Not on file   Highest education level: Associate degree: occupational, Scientist, product/process development, or vocational program  Occupational History   Not on file  Tobacco Use   Smoking status: Every Day    Current packs/day: 0.00    Average packs/day: 0.5 packs/day for 10.0 years (5.0 ttl pk-yrs)    Types: Cigarettes    Last attempt to quit: 06/21/2020    Years since quitting: 3.7   Smokeless tobacco: Never   Tobacco comments:    using electronic cigarettes too  Substance and Sexual Activity   Alcohol use: Yes    Comment: occasional   Drug use: Yes   Sexual activity: Yes    Partners: Male    Birth control/protection: Surgical  Other Topics Concern   Not on file  Social History Narrative   Not on file   Social Drivers of Health   Financial Resource Strain: Low Risk  (01/25/2024)   Overall  Financial Resource Strain (CARDIA)    Difficulty of Paying Living Expenses: Not hard at all  Food Insecurity: No Food Insecurity (01/25/2024)   Hunger Vital Sign    Worried About Running Out of Food in the Last Year: Never true    Ran Out of Food in the Last Year: Never true  Transportation Needs: No Transportation Needs (01/25/2024)   PRAPARE - Administrator, Civil Service (Medical): No    Lack of Transportation (Non-Medical): No  Physical Activity: Unknown (01/25/2024)   Exercise Vital Sign    Days of Exercise per Week: Patient declined    Minutes of Exercise per Session: Not on file  Stress: No Stress Concern Present (01/25/2024)   [employer] of Occupational Health - Occupational Stress Questionnaire    Feeling of Stress: Only a little  Social Connections: Socially Integrated (01/25/2024)   Social Connection and Isolation Panel    Frequency of Communication with Friends and Family: More than three times a week    Frequency of Social Gatherings with Friends and Family: Once a week    Attends Religious Services: More than 4 times per year  Active Member of Clubs or Organizations: Yes    Attends Engineer, structural: More than 4 times per year    Marital Status: Married   Family History  Problem Relation Age of Onset   Clotting disorder Mother    Hypertension Father    Diabetes Father    Clotting disorder Sister    Clotting disorder Brother    Clotting disorder Maternal Uncle    Pancreatic cancer Paternal Grandmother    Deep vein thrombosis Other    Colon cancer Neg Hx    Esophageal cancer Neg Hx    Rectal cancer Neg Hx    Stomach cancer Neg Hx    No Known Allergies Current Outpatient Medications  Medication Sig Dispense Refill   Estradiol  10 MCG TABS vaginal tablet Place 1 tablet (10 mcg total) vaginally 2 (two) times a week. 24 tablet 3   FLUoxetine  (PROZAC ) 10 MG capsule Take 1 capsule (10 mg total) by mouth at bedtime. 90 capsule 0    fluticasone  (FLONASE ) 50 MCG/ACT nasal spray Place 1 spray into both nostrils daily. 16 g 0   hydrochlorothiazide  (HYDRODIURIL ) 25 MG tablet Take 1 tablet (25 mg total) by mouth daily. 90 tablet 3   potassium chloride  SA (KLOR-CON  M) 20 MEQ tablet Take 1 tablet (20 mEq total) by mouth daily. 90 tablet 3   No current facility-administered medications for this visit.   No results found.  Review of Systems:   A ROS was performed including pertinent positives and negatives as documented in the HPI.  Physical Exam :   Constitutional: NAD and appears stated age Neurological: Alert and oriented Psych: Appropriate affect and cooperative There were no vitals taken for this visit.   Comprehensive Musculoskeletal Exam:    Exam of the left knee demonstrates tenderness over the medial joint line.  Active range of motion from 0 to 110 degrees with mild crepitus.  No overlying erythema or warmth.  Stable collaterals with varus and valgus stress.  Imaging:     Assessment:   58 y.o. female with chronic left knee pain in the setting of moderate osteoarthritis.  Unfortunately Orthovisc series performed 4 months ago did not give her any relief.  She has undergone cortisone injections in the past which were providing some short-term relief.  Today we discussed options for injections including cortisone, Zilretta , or potential PRP.  Given that symptoms are currently interfering with her daily activities, patient would like to proceed with a cortisone injection today.  This was performed in clinic and she tolerated this well.  Can plan to have her follow-up in clinic if symptoms recur.  Plan :    - Left knee cortisone injection performed today - Follow-up as needed    Procedure Note  Patient: Patricia [name]             Date of Birth: [birth year]           MRN: [id number]             Visit Date: 04/02/2024  Procedures: Visit Diagnoses:  1. Unilateral primary osteoarthritis, left knee      Large Joint Inj: L knee on 04/02/2024 5:32 PM Indications: pain Details: 22 G 1.5 in needle, anterolateral approach Medications: 4 mL lidocaine  1 %; 2 mL triamcinolone  acetonide 40 MG/ML Outcome: tolerated well, no immediate complications Procedure, treatment alternatives, risks and benefits explained, specific risks discussed. Consent was given by the patient. Immediately prior to procedure a time out was called to verify the  correct patient, procedure, equipment, support staff and site/side marked as required. Patient was prepped and draped in the usual sterile fashion.       I personally saw and evaluated the patient, and participated in the management and treatment plan.  Leonce Reveal, PA-C Orthopedics

## 2024-04-14 ENCOUNTER — Encounter: Payer: Self-pay | Admitting: Radiology

## 2024-04-16 NOTE — Progress Notes (Signed)
 @Patient  ID: Tedi KATHEE Lesches, female    DOB: 10-31-1965, 58 y.o.   MRN: [id number]  No chief complaint on file.   Referring provider: Mercer Clotilda SAUNDERS, MD  HPI: 58 year old female, current every day smoker. PMH HTN, pulmonary embolism, pre-diabetes, obesity.   Previous [facility] pulmonary encounter:  12/25/2023 Kynadee B. Ziska is a 58 year old female with sleep apnea who presents for a sleep consult.  She was diagnosed with sleep apnea approximately four years ago following a sleep study and was prescribed a CPAP machine, which she initially tolerated well. However, the machine was recalled, and she has not been using it since.  She continues to experience symptoms of sleep apnea, including daytime exhaustion, frequent awakenings at night, and snoring. Her husband has observed episodes where she stops breathing during sleep, prompting him to nudge her awake. She also experiences waking up gasping or choking and talking in her sleep.  Her sleep routine involves going to bed around 9:30 to 10:00 PM, falling asleep within 30 to 45 minutes, and waking up about four times a night. She typically starts her day at 6:00 AM. She experiences significant daytime sleepiness, especially in quiet environments, and needs to move around to stay awake.  Her weight has fluctuated by about 10 pounds over the past few months. No history of seizures. She reports a past pulmonary embolism in 2017, which was not provoked by surgery or other known factors. She is not currently on blood thinners.   04/17/2024 Discussed the use of AI scribe software for clinical note transcription with the patient, who gave verbal consent to proceed.  History of Present Illness   HST showed moderate sleep apnea, recommend she be started on CPAP auto 5-15cm h20 and fu in 31-90 days   Airview download 03/17/24-04/15/24 Usage days 30/30 days (100%); 29 days (97%) > 4 hours  Average usage 7 hours 9 minutes Pressure 5-15cm  20 Airleaks 3.1L/min  AHI 2.1   No Known Allergies  Immunization History  Administered Date(s) Administered   Influenza Split 03/13/2011, 04/01/2012   Influenza Whole 03/26/2010   Influenza,inj,Quad PF,6+ Mos 04/08/2013, 04/02/2017, 03/15/2018, 03/08/2019, 04/05/2020, 02/17/2021, 02/28/2022   Influenza-Unspecified 03/28/2023   Janssen (J&J) SARS-COV-2 Vaccination 08/24/2019   PFIZER(Purple Top)SARS-COV-2 Vaccination 12/22/2020   Td 08/14/2008   Tdap 02/28/2022   Unspecified SARS-COV-2 Vaccination 03/28/2023   Zoster Recombinant(Shingrix ) 02/28/2022, 05/22/2022    Past Medical History:  Diagnosis Date   Clotting disorder    Hypertension    Sleep apnea     Tobacco History: Social History   Tobacco Use  Smoking Status Every Day   Current packs/day: 0.00   Average packs/day: 0.5 packs/day for 10.0 years (5.0 ttl pk-yrs)   Types: Cigarettes   Last attempt to quit: 06/21/2020   Years since quitting: 3.8  Smokeless Tobacco Never  Tobacco Comments   using electronic cigarettes too   Ready to quit: Not Answered Counseling given: Not Answered Tobacco comments: using electronic cigarettes too   Outpatient Medications Prior to Visit  Medication Sig Dispense Refill   Estradiol  10 MCG TABS vaginal tablet Place 1 tablet (10 mcg total) vaginally 2 (two) times a week. 24 tablet 3   FLUoxetine  (PROZAC ) 10 MG capsule Take 1 capsule (10 mg total) by mouth at bedtime. 90 capsule 0   fluticasone  (FLONASE ) 50 MCG/ACT nasal spray Place 1 spray into both nostrils daily. 16 g 0   hydrochlorothiazide  (HYDRODIURIL ) 25 MG tablet Take 1 tablet (25 mg total) by  mouth daily. 90 tablet 3   potassium chloride  SA (KLOR-CON  M) 20 MEQ tablet Take 1 tablet (20 mEq total) by mouth daily. 90 tablet 3   No facility-administered medications prior to visit.      Review of Systems  Review of Systems   Physical Exam  There were no vitals taken for this visit. Physical Exam  ***  Lab  Results:  CBC    Component Value Date/Time   WBC 8.0 05/25/2023 1217   RBC 4.54 05/25/2023 1217   HGB 14.0 05/25/2023 1217   HCT 42.4 05/25/2023 1217   PLT 347.0 05/25/2023 1217   MCV 93.4 05/25/2023 1217   MCH 30.1 04/05/2020 0848   MCHC 32.9 05/25/2023 1217   RDW 14.3 05/25/2023 1217   LYMPHSABS 3.8 05/25/2023 1217   MONOABS 0.5 05/25/2023 1217   EOSABS 0.2 05/25/2023 1217   BASOSABS 0.1 05/25/2023 1217    BMET    Component Value Date/Time   NA 139 10/08/2023 0854   K 3.7 10/08/2023 0854   CL 101 10/08/2023 0854   CO2 31 10/08/2023 0854   GLUCOSE 97 10/08/2023 0854   BUN 16 10/08/2023 0854   CREATININE 0.85 10/08/2023 0854   CREATININE 0.83 04/05/2020 0848   CALCIUM 9.0 10/08/2023 0854   GFRNONAA 81 04/05/2020 0848   GFRAA 93 04/05/2020 0848    BNP    Component Value Date/Time   BNP 66.9 02/09/2016 1628    ProBNP    Component Value Date/Time   PROBNP 21.0 12/12/2019 1208    Imaging: No results found.   Assessment & Plan:   No problem-specific Assessment & Plan notes found for this encounter.   There are no diagnoses linked to this encounter.  Assessment and Plan Assessment & Plan       I personally spent a total of *** minutes in the care of the patient today including {Time Based Coding:210964241}.   Almarie LELON Ferrari, NP 04/16/2024

## 2024-04-17 ENCOUNTER — Ambulatory Visit: Admitting: Primary Care

## 2024-04-17 ENCOUNTER — Encounter: Payer: Self-pay | Admitting: Primary Care

## 2024-04-17 VITALS — BP 130/70 | HR 83 | Temp 97.5°F | Ht 67.0 in | Wt 270.8 lb

## 2024-04-17 DIAGNOSIS — G4733 Obstructive sleep apnea (adult) (pediatric): Secondary | ICD-10-CM | POA: Diagnosis not present

## 2024-04-17 DIAGNOSIS — I83893 Varicose veins of bilateral lower extremities with other complications: Secondary | ICD-10-CM | POA: Diagnosis not present

## 2024-04-17 DIAGNOSIS — Z6841 Body Mass Index (BMI) 40.0 and over, adult: Secondary | ICD-10-CM | POA: Diagnosis not present

## 2024-04-17 DIAGNOSIS — Z23 Encounter for immunization: Secondary | ICD-10-CM

## 2024-04-17 DIAGNOSIS — Z0000 Encounter for general adult medical examination without abnormal findings: Secondary | ICD-10-CM

## 2024-04-17 DIAGNOSIS — F17210 Nicotine dependence, cigarettes, uncomplicated: Secondary | ICD-10-CM

## 2024-04-17 NOTE — Addendum Note (Signed)
 Addended by: Paiton Fosco T on: 04/17/2024 11:25 AM   Modules accepted: Orders

## 2024-04-17 NOTE — Patient Instructions (Addendum)
   VISIT SUMMARY: Today, you had a follow-up appointment to discuss your CPAP therapy for sleep apnea and to evaluate your leg cramps. We reviewed your history of sleep apnea and your current use of CPAP therapy, which you are using consistently with good results. We also discussed your leg cramps and the visible veins in your lower legs. Additionally, we talked about your smoking and the importance of quitting for your overall health.  YOUR PLAN: -OBSTRUCTIVE SLEEP APNEA: Obstructive sleep apnea is a condition where your airway becomes blocked during sleep, causing breathing pauses. Your CPAP therapy is working well, and you are using it consistently. We discussed the potential benefits of weight loss in reducing your apneic events and possibly reversing the condition. We also talked about starting Zepbound, a medication that can help with weight loss and sleep apnea, pending your insurance coverage. Continue using your CPAP machine with regular maintenance, and we will follow up in 1-2 months after starting Zepbound if it is covered by your insurance.  -VARICOSE VEINS AND LEG CRAMPS: Varicose veins are enlarged veins that can cause pain and cramps in your legs. Your leg cramps may be related to these veins or to dehydration and electrolyte imbalances. We discussed the importance of staying hydrated, doing stretching exercises, and using compression stockings. We also recommended using heating pads for relief. We may refer you to a vascular specialist for further evaluation.  -TOBACCO USE: Smoking has many negative effects on your health. We discussed the importance of quitting smoking to improve your overall health.  INSTRUCTIONS: Please check with your insurance about coverage for Zepbound. If it is covered, we will start you on 2.5 mg subcutaneous injections once a week, with dose increases every four weeks as tolerated, aiming for a target dose of 10-15 mg. Continue using your CPAP machine as  directed, and maintain regular cleaning and supply changes. Stay hydrated, do stretching exercises, and use compression stockings and heating pads for your leg cramps. Consider seeing a vascular specialist for your varicose veins. We will schedule a follow-up appointment in 1-2 months after starting Zepbound if it is covered.    Notify your provider if you are planning to have a procedure/surgery, as this medication will need to be stopped prior.

## 2024-04-21 ENCOUNTER — Ambulatory Visit: Admitting: Primary Care

## 2024-05-03 ENCOUNTER — Ambulatory Visit: Admitting: Radiology

## 2024-05-03 ENCOUNTER — Ambulatory Visit
Admission: RE | Admit: 2024-05-03 | Discharge: 2024-05-03 | Disposition: A | Discharge status: Home / Self Care | Source: Ambulatory Visit | Attending: Emergency Medicine | Admitting: Emergency Medicine

## 2024-05-03 VITALS — BP 126/82 | HR 76 | Temp 98.0°F | Resp 16

## 2024-05-03 DIAGNOSIS — S8992XA Unspecified injury of left lower leg, initial encounter: Secondary | ICD-10-CM

## 2024-05-03 MED ORDER — KETOROLAC TROMETHAMINE 30 MG/ML IJ SOLN
30.0000 mg | Freq: Once | INTRAMUSCULAR | Status: AC
  Administered 2024-05-03: 30 mg via INTRAMUSCULAR

## 2024-05-03 MED ORDER — CYCLOBENZAPRINE HCL 10 MG PO TABS: 10.0000 mg | ORAL_TABLET | Freq: Every evening | ORAL | 0 refills | Status: AC | PRN

## 2024-05-03 NOTE — ED Provider Notes (Signed)
 [name]    CSN: [id number] Arrival date & time: 05/03/24  0850      History   Chief Complaint Chief Complaint  Patient presents with   Patricia [name] in the tub and having knee, shin and ankle pain - Entered by patient    HPI [name] is a 58 y.o. female.   Slipped last night getting into the shower Her left knee hit the base of the tub, and also hit her shin and ankle. Her right cheek hit the bathroom wall. Did not lose consciousness. No headache. Not anticoagulated.  Pain is in the left lower extremity, from knee to ankle. Rating 9/10 She has worsening pain with movement and weight bearing.  Took a goodie powder around 1 AM without relief. No other meds today   Left knee arthritis followed by ortho. Had a steroid shot 1 month ago   Past Medical History:  Diagnosis Date   Clotting disorder    Hypertension    Sleep apnea     Patient Active Problem List   Diagnosis Date Noted   Snoring 10/08/2023   Cigarette nicotine  dependence without complication 10/08/2023   Primary osteoarthritis of both knees 03/22/2022   Prediabetes 04/06/2020   Pulmonary embolism without acute cor pulmonale (HCC) 02/09/2016   Essential hypertension 02/09/2016   Hypokalemia 02/09/2016   Obesity 04/01/2012   Tobacco abuse 04/01/2012   HEADACHE, TENSION 06/17/2010   ANKLE EDEMA 06/17/2010   Otitis media 03/18/2010   ACUTE BRONCHITIS 03/18/2010   VOMITING 03/18/2010   Acute upper respiratory infection 03/15/2009   ADVERSE DRUG REACTION 08/18/2008   PLANTAR FASCIITIS, RIGHT 08/14/2008   GASTROENTERITIS 03/20/2008    Past Surgical History:  Procedure Laterality Date   ABDOMINAL HYSTERECTOMY     BREAST REDUCTION SURGERY     KNEE SURGERY Left    REDUCTION MAMMAPLASTY Bilateral    TUBAL LIGATION      OB History     Gravida  2   Para  2   Term  2   Preterm      AB      Living  2      SAB      IAB      Ectopic      Multiple      Live  Births               Home Medications    Prior to Admission medications   Medication Sig Start Date End Date Taking? Authorizing Provider  cyclobenzaprine  (FLEXERIL ) 10 MG tablet Take 1 tablet (10 mg total) by mouth at bedtime as needed for muscle spasms. 05/03/24  Yes Emmette Katt, [place], PA-C  Estradiol  10 MCG TABS vaginal tablet Place 1 tablet (10 mcg total) vaginally 2 (two) times a week. 11/14/21   Chrzanowski, Jami B, NP  FLUoxetine  (PROZAC ) 10 MG capsule Take 1 capsule (10 mg total) by mouth at bedtime. 08/09/23   Chrzanowski, Jami B, NP  fluticasone  (FLONASE ) 50 MCG/ACT nasal spray Place 1 spray into both nostrils daily. 07/20/22   Mercer Clotilda SAUNDERS, MD  hydrochlorothiazide  (HYDRODIURIL ) 25 MG tablet Take 1 tablet (25 mg total) by mouth daily. 10/08/23   Mercer Clotilda SAUNDERS, MD  potassium chloride  SA (KLOR-CON  M) 20 MEQ tablet Take 1 tablet (20 mEq total) by mouth daily. 10/08/23   Mercer Clotilda SAUNDERS, MD    Family History Family History  Problem Relation Age of Onset   Clotting disorder Mother  Hypertension Father    Diabetes Father    Clotting disorder Sister    Clotting disorder Brother    Clotting disorder Maternal Uncle    Pancreatic cancer Paternal Grandmother    Deep vein thrombosis Other    Colon cancer Neg Hx    Esophageal cancer Neg Hx    Rectal cancer Neg Hx    Stomach cancer Neg Hx     Social History Social History   Tobacco Use   Smoking status: Every Day    Current packs/day: 0.00    Average packs/day: 0.5 packs/day for 10.0 years (5.0 ttl pk-yrs)    Types: Cigarettes    Last attempt to quit: 06/21/2020    Years since quitting: 3.8   Smokeless tobacco: Never   Tobacco comments:    using electronic cigarettes too  Substance Use Topics   Alcohol use: Yes    Comment: occasional   Drug use: Yes     Allergies   Patient has no known allergies.   Review of Systems Review of Systems  As per HPI  Physical Exam Triage Vital Signs ED Triage Vitals   Encounter Vitals Group     BP 05/03/24 0857 126/82     Girls Systolic BP Percentile --      Girls Diastolic BP Percentile --      Boys Systolic BP Percentile --      Boys Diastolic BP Percentile --      Pulse Rate 05/03/24 0857 76     Resp 05/03/24 0857 16     Temp 05/03/24 0857 98 F (36.7 C)     Temp Source 05/03/24 0857 Oral     SpO2 05/03/24 0857 98 %     Weight --      Height --      Head Circumference --      Peak Flow --      Pain Score 05/03/24 0904 9     Pain Loc --      Pain Education --      Exclude from Growth Chart --    No data found.  Updated Vital Signs BP 126/82 (BP Location: Left Arm)   Pulse 76   Temp 98 F (36.7 C) (Oral)   Resp 16   SpO2 98%     Physical Exam Vitals and nursing note reviewed.  Constitutional:      General: She is not in acute distress. HENT:     Mouth/Throat:     Pharynx: Oropharynx is clear.  Cardiovascular:     Rate and Rhythm: Normal rate and regular rhythm.     Pulses: Normal pulses.  Pulmonary:     Effort: Pulmonary effort is normal.  Musculoskeletal:     Cervical back: Normal range of motion.     Left hip: Normal. No bony tenderness.     Left upper leg: No bony tenderness.     Left knee: Swelling and bony tenderness present. Decreased range of motion.     Left lower leg: Bony tenderness present.     Left ankle: Tenderness present. Decreased range of motion.     Comments: Left knee 1+ swelling with generalized tenderness, bony tenderness over patella. Bony tenderness extending down length of tibia, to anterior ankle. No bony tenderness at medial/lateral malleolus. Decreased ROM at ankle and knee due to pain. Distal sensation intact. Strong DP and PT pulses. Cap refill < 2 seconds.  Skin:    General: Skin is warm and dry.  Capillary Refill: Capillary refill takes less than 2 seconds.  Neurological:     Mental Status: She is alert and oriented to person, place, and time.     Sensory: No sensory deficit.       UC Treatments / Results  Labs (all labs ordered are listed, but only abnormal results are displayed) Labs Reviewed - No data to display  EKG   Radiology DG Knee AP/LAT W/Sunrise Left Result Date: 05/03/2024 CLINICAL DATA:  fall and pain EXAM: LEFT KNEE 3 VIEWS; LEFT TIBIA AND FIBULA - 2 VIEW; LEFT ANKLE - 2 VIEW COMPARISON:  September 14, 2022 FINDINGS: No acute fracture or dislocation. Bulky osteophyte formation along the lateral compartment with osseous remodeling of the lateral tibial plateau, similar compared to prior. Moderate joint space narrowing with mild osteophyte formation of the medial compartment. Mild-to-moderate joint space narrowing and osteophyte formation of the patellofemoral compartment. Bidirectional calcaneal enthesophytes. Mild midfoot degenerative changes. Ankle mortise is suboptimally assessed on limited oblique radiograph. No area of erosion or osseous destruction. No unexpected radiopaque foreign body. Soft tissues are unremarkable. IMPRESSION: 1. No acute fracture or dislocation. 2. Tricompartmental degenerative changes of the left knee, most pronounced in the lateral compartment. Electronically Signed   By: Corean Salter M.D.   On: 05/03/2024 10:01   DG Tibia/Fibula Left Result Date: 05/03/2024 CLINICAL DATA:  fall and pain EXAM: LEFT KNEE 3 VIEWS; LEFT TIBIA AND FIBULA - 2 VIEW; LEFT ANKLE - 2 VIEW COMPARISON:  September 14, 2022 FINDINGS: No acute fracture or dislocation. Bulky osteophyte formation along the lateral compartment with osseous remodeling of the lateral tibial plateau, similar compared to prior. Moderate joint space narrowing with mild osteophyte formation of the medial compartment. Mild-to-moderate joint space narrowing and osteophyte formation of the patellofemoral compartment. Bidirectional calcaneal enthesophytes. Mild midfoot degenerative changes. Ankle mortise is suboptimally assessed on limited oblique radiograph. No area of erosion or osseous  destruction. No unexpected radiopaque foreign body. Soft tissues are unremarkable. IMPRESSION: 1. No acute fracture or dislocation. 2. Tricompartmental degenerative changes of the left knee, most pronounced in the lateral compartment. Electronically Signed   By: Corean Salter M.D.   On: 05/03/2024 10:01   DG Ankle 2 Views Left Result Date: 05/03/2024 CLINICAL DATA:  fall and pain EXAM: LEFT KNEE 3 VIEWS; LEFT TIBIA AND FIBULA - 2 VIEW; LEFT ANKLE - 2 VIEW COMPARISON:  September 14, 2022 FINDINGS: No acute fracture or dislocation. Bulky osteophyte formation along the lateral compartment with osseous remodeling of the lateral tibial plateau, similar compared to prior. Moderate joint space narrowing with mild osteophyte formation of the medial compartment. Mild-to-moderate joint space narrowing and osteophyte formation of the patellofemoral compartment. Bidirectional calcaneal enthesophytes. Mild midfoot degenerative changes. Ankle mortise is suboptimally assessed on limited oblique radiograph. No area of erosion or osseous destruction. No unexpected radiopaque foreign body. Soft tissues are unremarkable. IMPRESSION: 1. No acute fracture or dislocation. 2. Tricompartmental degenerative changes of the left knee, most pronounced in the lateral compartment. Electronically Signed   By: Corean Salter M.D.   On: 05/03/2024 10:01    Procedures Procedures (including critical care time)  Medications Ordered in UC Medications  ketorolac  (TORADOL ) 30 MG/ML injection 30 mg (30 mg Intramuscular Given 05/03/24 0933)    Initial Impression / Assessment and Plan / UC Course  I have reviewed the triage vital signs and the nursing notes.  Pertinent labs & imaging results that were available during my care of the patient were reviewed by me and considered  in my medical decision making (see chart for details).  In wheelchair due to pain with weightbearing Stable vitals Bony tenderness of the left knee, extending  down the length of tibia, and at the anterior ankle. No pain in the foot itself.  Neurovascularly intact  IM toradol  given in clinic for pain relief Good kidney function Patient reports improvement. Down to 5/10 (started 9)  Left knee xray with degenerative changes, these are known, not an acute finding. Left tibia/fibula and ankle images without bony abnormality. Images independently reviewed by me, agree with radiology interpretation. Discussed with patient Ace wraps applied to knee and ankle in clinic. Flexeril  at bedtime, drowsy precautions. Supportive care. Ortho follow up.  Strict ED precautions are discussed. Understands signs and symptoms to monitor for that warrant immediate re-evaluation. Especially severe pain, loss of sensation, pale and cold skin, further limited ROM, etc.   Final Clinical Impressions(s) / UC Diagnoses   Final diagnoses:  Injury of left lower extremity, initial encounter     Discharge Instructions      You have not broken any bones! You likely have bruising and swelling from the injury that is causing pain.   A Toradol  injection was given today. Please do not use any NSAIDs (ibuprofen/Advil, naproxen/Aleve, etc) for the next 24 hours. You can safely use tylenol .   You can take the muscle relaxer Flexeril  at bed time. Use caution as this may make you drowsy!  Use ace wrap to support the joints Elevate and apply ice to reduce swelling and pain. It may take up to a week for improvement. Please follow up with your orthopedic specialist.      ED Prescriptions     Medication Sig Dispense Auth. Provider   cyclobenzaprine  (FLEXERIL ) 10 MG tablet Take 1 tablet (10 mg total) by mouth at bedtime as needed for muscle spasms. 20 tablet Tosh Glaze, [place], PA-C      PDMP not reviewed this encounter.   Jeryl Asberry, [place] 05/03/24 1040

## 2024-05-03 NOTE — ED Triage Notes (Signed)
 Pt states she fell in the tub last night C/o left knee, shin, and ankle pain. States knee has the most pain. Pt has bruising on shin

## 2024-05-03 NOTE — Discharge Instructions (Addendum)
 You have not broken any bones! You likely have bruising and swelling from the injury that is causing pain.   A Toradol  injection was given today. Please do not use any NSAIDs (ibuprofen/Advil, naproxen/Aleve, etc) for the next 24 hours. You can safely use tylenol .   You can take the muscle relaxer Flexeril  at bed time. Use caution as this may make you drowsy!  Use ace wrap to support the joints Elevate and apply ice to reduce swelling and pain. It may take up to a week for improvement. Please follow up with your orthopedic specialist.

## 2024-05-26 ENCOUNTER — Encounter: Admitting: Family Medicine

## 2024-06-20 ENCOUNTER — Encounter: Payer: Self-pay | Admitting: Family Medicine
